# Patient Record
Sex: Male | Born: 1950 | Race: White | Hispanic: No | Marital: Married | Smoking: Never smoker
Health system: Southern US, Community
[De-identification: ages and names within clinical notes are randomized; demographics above are authoritative.]

## PROBLEM LIST (undated history)

## (undated) DIAGNOSIS — I10 Essential (primary) hypertension: Secondary | ICD-10-CM

## (undated) DIAGNOSIS — E785 Hyperlipidemia, unspecified: Secondary | ICD-10-CM

## (undated) DIAGNOSIS — K219 Gastro-esophageal reflux disease without esophagitis: Secondary | ICD-10-CM

## (undated) DIAGNOSIS — K5720 Diverticulitis of large intestine with perforation and abscess without bleeding: Secondary | ICD-10-CM

## (undated) DIAGNOSIS — K5732 Diverticulitis of large intestine without perforation or abscess without bleeding: Secondary | ICD-10-CM

## (undated) DIAGNOSIS — R51 Headache: Secondary | ICD-10-CM

## (undated) DIAGNOSIS — R519 Headache, unspecified: Secondary | ICD-10-CM

## (undated) DIAGNOSIS — K625 Hemorrhage of anus and rectum: Secondary | ICD-10-CM

## (undated) DIAGNOSIS — N400 Enlarged prostate without lower urinary tract symptoms: Secondary | ICD-10-CM

## (undated) HISTORY — DX: Diverticulitis of large intestine with perforation and abscess without bleeding: K57.20

## (undated) HISTORY — PX: FOOT SURGERY: SHX648

## (undated) HISTORY — PX: NASAL SEPTUM SURGERY: SHX37

## (undated) HISTORY — DX: Diverticulitis of large intestine without perforation or abscess without bleeding: K57.32

---

## 2013-05-18 HISTORY — PX: INGUINAL HERNIA REPAIR: SUR1180

## 2014-12-07 ENCOUNTER — Encounter
Admission: RE | Disposition: A | Discharge status: Home / Self Care | Payer: Self-pay | Source: Ambulatory Visit | Attending: Unknown Physician Specialty

## 2014-12-07 ENCOUNTER — Encounter: Payer: Self-pay | Admitting: Anesthesiology

## 2014-12-07 ENCOUNTER — Ambulatory Visit: Payer: Medicare Other | Admitting: Anesthesiology

## 2014-12-07 ENCOUNTER — Ambulatory Visit
Admission: RE | Admit: 2014-12-07 | Discharge: 2014-12-07 | Disposition: A | Discharge status: Home / Self Care | Payer: Medicare Other | Source: Ambulatory Visit | Attending: Unknown Physician Specialty | Admitting: Unknown Physician Specialty

## 2014-12-07 DIAGNOSIS — N400 Enlarged prostate without lower urinary tract symptoms: Secondary | ICD-10-CM | POA: Insufficient documentation

## 2014-12-07 DIAGNOSIS — Z1211 Encounter for screening for malignant neoplasm of colon: Secondary | ICD-10-CM | POA: Insufficient documentation

## 2014-12-07 DIAGNOSIS — K648 Other hemorrhoids: Secondary | ICD-10-CM | POA: Diagnosis not present

## 2014-12-07 DIAGNOSIS — E785 Hyperlipidemia, unspecified: Secondary | ICD-10-CM | POA: Insufficient documentation

## 2014-12-07 DIAGNOSIS — I10 Essential (primary) hypertension: Secondary | ICD-10-CM | POA: Insufficient documentation

## 2014-12-07 DIAGNOSIS — D123 Benign neoplasm of transverse colon: Secondary | ICD-10-CM | POA: Diagnosis not present

## 2014-12-07 DIAGNOSIS — D124 Benign neoplasm of descending colon: Secondary | ICD-10-CM | POA: Insufficient documentation

## 2014-12-07 DIAGNOSIS — K5730 Diverticulosis of large intestine without perforation or abscess without bleeding: Secondary | ICD-10-CM | POA: Diagnosis not present

## 2014-12-07 HISTORY — DX: Benign prostatic hyperplasia without lower urinary tract symptoms: N40.0

## 2014-12-07 HISTORY — DX: Hemorrhage of anus and rectum: K62.5

## 2014-12-07 HISTORY — DX: Headache: R51

## 2014-12-07 HISTORY — PX: COLONOSCOPY WITH PROPOFOL: SHX5780

## 2014-12-07 HISTORY — DX: Headache, unspecified: R51.9

## 2014-12-07 HISTORY — DX: Essential (primary) hypertension: I10

## 2014-12-07 HISTORY — DX: Hyperlipidemia, unspecified: E78.5

## 2014-12-07 SURGERY — COLONOSCOPY WITH PROPOFOL
Anesthesia: General

## 2014-12-07 MED ORDER — PROPOFOL INFUSION 10 MG/ML OPTIME
INTRAVENOUS | Status: DC | PRN
  Administered 2014-12-07: 120 ug/kg/min via INTRAVENOUS

## 2014-12-07 MED ORDER — SODIUM CHLORIDE 0.9 % IV SOLN: INTRAVENOUS | Status: DC

## 2014-12-07 MED ORDER — FENTANYL CITRATE (PF) 100 MCG/2ML IJ SOLN
INTRAMUSCULAR | Status: DC | PRN
  Administered 2014-12-07: 50 ug via INTRAVENOUS

## 2014-12-07 MED ORDER — CEFAZOLIN SODIUM 1-5 GM-% IV SOLN
INTRAVENOUS | Status: DC | PRN
  Administered 2014-12-07: 1 g via INTRAVENOUS

## 2014-12-07 MED ORDER — SODIUM CHLORIDE 0.9 % IV SOLN
INTRAVENOUS | Status: DC
  Administered 2014-12-07: 11:00:00 via INTRAVENOUS
  Administered 2014-12-07: 1000 mL via INTRAVENOUS

## 2014-12-07 MED ORDER — MIDAZOLAM HCL 2 MG/2ML IJ SOLN
INTRAMUSCULAR | Status: DC | PRN
  Administered 2014-12-07: 1 mg via INTRAVENOUS

## 2014-12-07 NOTE — Anesthesia Postprocedure Evaluation (Signed)
  Anesthesia Post-op Note  Patient: Ross Reynolds  Procedure(s) Performed: Procedure(s): COLONOSCOPY WITH PROPOFOL (N/A)  Anesthesia type:General  Patient location: PACU  Post pain: Pain level controlled  Post assessment: Post-op Vital signs reviewed, Patient's Cardiovascular Status Stable, Respiratory Function Stable, Patent Airway and No signs of Nausea or vomiting  Post vital signs: Reviewed and stable  Last Vitals:  Filed Vitals:   12/07/14 1120  BP: 111/69  Pulse: 67  Temp:   Resp: 17    Level of consciousness: awake, alert  and patient cooperative  Complications: No apparent anesthesia complications

## 2014-12-07 NOTE — Anesthesia Preprocedure Evaluation (Signed)
Anesthesia Evaluation  Patient identified by MRN, date of birth, ID band Patient awake    Reviewed: Allergy & Precautions, NPO status , Patient's Chart, lab work & pertinent test results, reviewed documented beta blocker date and time   Airway Mallampati: II  TM Distance: >3 FB     Dental  (+) Chipped   Pulmonary          Cardiovascular hypertension,     Neuro/Psych  Headaches,    GI/Hepatic   Endo/Other    Renal/GU      Musculoskeletal   Abdominal   Peds  Hematology   Anesthesia Other Findings   Reproductive/Obstetrics                             Anesthesia Physical Anesthesia Plan  ASA: II  Anesthesia Plan: General   Post-op Pain Management:    Induction: Intravenous  Airway Management Planned: Nasal Cannula  Additional Equipment:   Intra-op Plan:   Post-operative Plan:   Informed Consent: I have reviewed the patients History and Physical, chart, labs and discussed the procedure including the risks, benefits and alternatives for the proposed anesthesia with the patient or authorized representative who has indicated his/her understanding and acceptance.     Plan Discussed with: CRNA  Anesthesia Plan Comments:         Anesthesia Quick Evaluation

## 2014-12-07 NOTE — Transfer of Care (Signed)
Immediate Anesthesia Transfer of Care Note  Patient: Ross Reynolds  Procedure(s) Performed: Procedure(s): COLONOSCOPY WITH PROPOFOL (N/A)  Patient Location: PACU and Endoscopy Unit  Anesthesia Type:General  Level of Consciousness: awake, alert  and oriented  Airway & Oxygen Therapy: Patient Spontanous Breathing and Patient connected to nasal cannula oxygen  Post-op Assessment: Report given to RN and Post -op Vital signs reviewed and stable  Post vital signs: Reviewed and stable  Last Vitals:  Filed Vitals:   12/07/14 1109  BP: 90/51  Pulse: 79  Temp: 36.5 C  Resp: 14    Complications: No apparent anesthesia complications

## 2014-12-07 NOTE — Op Note (Signed)
[facility]enter[facility] Patient Name: Ross Reynolds Procedure Date: 12/07/2014 10:13 AM MRN: [id number] Account #: [id number] Date of Birth: Jul 05, 1950 Admit Type: Outpatient Age: 64 Room: [facility] ENDO ROOM 1 Gender: Male Note Status: Finalized Procedure:         Colonoscopy Indications:       Screening for colorectal malignant neoplasm Providers:         Manya Silvas, MD Referring MD:      Caprice Renshaw (Referring MD) Medicines:         Propofol per Anesthesia Complications:     No immediate complications. Procedure:         Pre-Anesthesia Assessment:                    - After reviewing the risks and benefits, the patient was                     deemed in satisfactory condition to undergo the procedure.                    After obtaining informed consent, the colonoscope was                     passed under direct vision. Throughout the procedure, the                     patient's blood pressure, pulse, and oxygen saturations                     were monitored continuously. The Colonoscope was                     introduced through the anus and advanced to the the cecum,                     identified by appendiceal orifice and ileocecal valve. The                     colonoscopy was performed without difficulty. The patient                     tolerated the procedure well. The quality of the bowel                     preparation was good. Findings:      Patient given a gram of Ancef before the procedure slow iv push.      A diminutive polyp was found at the hepatic flexure. The polyp was       sessile. The polyp was removed with a jumbo cold forceps. Resection and       retrieval were complete.      A diminutive polyp was found at the splenic flexure. The polyp was       sessile. The polyp was removed with a jumbo cold forceps. Resection and       retrieval were complete.      A diminutive polyp was found in the descending colon. The polyp was    sessile. The polyp was removed with a jumbo cold forceps. Resection and       retrieval were complete.      Multiple small-mouthed diverticula were found in the sigmoid colon, in       the descending colon and in the transverse colon.      Internal hemorrhoids  were found during endoscopy. The hemorrhoids were       small and Grade I (internal hemorrhoids that do not prolapse).      The exam was otherwise without abnormality. Impression:        - One diminutive polyp at the hepatic flexure. Resected                     and retrieved.                    - One diminutive polyp at the splenic flexure. Resected                     and retrieved.                    - One diminutive polyp in the descending colon. Resected                     and retrieved.                    - Diverticulosis in the sigmoid colon, in the descending                     colon and in the transverse colon.                    - Internal hemorrhoids.                    - The examination was otherwise normal. Recommendation:    - Await pathology results. Manya Silvas, MD 12/07/2014 11:06:17 AM This report has been signed electronically. Number of Addenda: 0 Note Initiated On: 12/07/2014 10:13 AM Scope Withdrawal Time: 0 hours 14 minutes 57 seconds  Total Procedure Duration: 0 hours 21 minutes 32 seconds       [facility]

## 2014-12-07 NOTE — H&P (Signed)
Primary Care Physician:  Marcello Fennel, MD Primary Gastroenterologist:  Dr. Vira Agar  Pre-Procedure History & Physical: HPI:  Ross Reynolds is a 64 y.o. male is here for an colonoscopy.   Past Medical History  Diagnosis Date  . Hypertension   . Headache   . Rectal bleeding   . BPH (benign prostatic hypertrophy)   . Hyperlipemia     History reviewed. No pertinent past surgical history.  Prior to Admission medications   Medication Sig Start Date End Date Taking? Authorizing Provider  fenofibrate (TRICOR) 145 MG tablet Take 145 mg by mouth daily.   Yes Historical Provider, MD  fluticasone (VERAMYST) 27.5 MCG/SPRAY nasal spray Place 2 sprays into the nose daily.   Yes Historical Provider, MD  meloxicam (MOBIC) 15 MG tablet Take 15 mg by mouth daily.   Yes Historical Provider, MD  tamsulosin (FLOMAX) 0.4 MG CAPS capsule Take 0.4 mg by mouth.   Yes Historical Provider, MD  tiZANidine (ZANAFLEX) 4 MG capsule Take 4 mg by mouth 3 (three) times daily.   Yes Historical Provider, MD    Allergies as of 12/06/2014  . (No Known Allergies)    History reviewed. No pertinent family history.  History   Social History  . Marital Status: Married    Spouse Name: N/A  . Number of Children: N/A  . Years of Education: N/A   Occupational History  . Not on file.   Social History Main Topics  . Smoking status: Not on file  . Smokeless tobacco: Not on file  . Alcohol Use: Not on file  . Drug Use: Not on file  . Sexual Activity: Not on file   Other Topics Concern  . Not on file   Social History Narrative    Review of Systems: See HPI, otherwise negative ROS  Physical Exam: BP 129/94 mmHg  Pulse 91  Temp(Src) 97.9 F (36.6 C) (Tympanic)  Resp 16  Ht 5\' 11"  (1.803 m)  Wt 88.451 kg (195 lb)  BMI 27.21 kg/m2  SpO2 94% General:   Alert,  pleasant and cooperative in NAD Head:  Normocephalic and atraumatic. Neck:  Supple; no masses or thyromegaly. Lungs:  Clear throughout to  auscultation.    Heart:  Regular rate and rhythm. Abdomen:  Soft, nontender and nondistended. Normal bowel sounds, without guarding, and without rebound.   Neurologic:  Alert and  oriented x4;  grossly normal neurologically.  Impression/Plan: Ross Reynolds is here for an colonoscopy to be performed for screening  Risks, benefits, limitations, and alternatives regarding  colonoscopy have been reviewed with the patient.  Questions have been answered.  All parties agreeable.   Ross Cheers, MD  12/07/2014, 10:16 AM   Primary Care Physician:  Marcello Fennel, MD Primary Gastroenterologist:  Dr. Vira Agar  Pre-Procedure History & Physical: HPI:  Ross Reynolds is a 64 y.o. male is here for an colonoscopy.   Past Medical History  Diagnosis Date  . Hypertension   . Headache   . Rectal bleeding   . BPH (benign prostatic hypertrophy)   . Hyperlipemia     History reviewed. No pertinent past surgical history.  Prior to Admission medications   Medication Sig Start Date End Date Taking? Authorizing Provider  fenofibrate (TRICOR) 145 MG tablet Take 145 mg by mouth daily.   Yes Historical Provider, MD  fluticasone (VERAMYST) 27.5 MCG/SPRAY nasal spray Place 2 sprays into the nose daily.   Yes Historical Provider, MD  meloxicam (MOBIC) 15 MG tablet Take 15  mg by mouth daily.   Yes Historical Provider, MD  tamsulosin (FLOMAX) 0.4 MG CAPS capsule Take 0.4 mg by mouth.   Yes Historical Provider, MD  tiZANidine (ZANAFLEX) 4 MG capsule Take 4 mg by mouth 3 (three) times daily.   Yes Historical Provider, MD    Allergies as of 12/06/2014  . (No Known Allergies)    History reviewed. No pertinent family history.  History   Social History  . Marital Status: Married    Spouse Name: N/A  . Number of Children: N/A  . Years of Education: N/A   Occupational History  . Not on file.   Social History Main Topics  . Smoking status: Not on file  . Smokeless tobacco: Not on file  . Alcohol Use:  Not on file  . Drug Use: Not on file  . Sexual Activity: Not on file   Other Topics Concern  . Not on file   Social History Narrative    Review of Systems: See HPI, otherwise negative ROS  Physical Exam: BP 129/94 mmHg  Pulse 91  Temp(Src) 97.9 F (36.6 C) (Tympanic)  Resp 16  Ht 5\' 11"  (1.803 m)  Wt 88.451 kg (195 lb)  BMI 27.21 kg/m2  SpO2 94% General:   Alert,  pleasant and cooperative in NAD Head:  Normocephalic and atraumatic. Neck:  Supple; no masses or thyromegaly. Lungs:  Clear throughout to auscultation.    Heart:  Regular rate and rhythm. Abdomen:  Soft, nontender and nondistended. Normal bowel sounds, without guarding, and without rebound.   Neurologic:  Alert and  oriented x4;  grossly normal neurologically.  Impression/Plan: Ross Reynolds is here for an colonoscopy to be performed for screening  Risks, benefits, limitations, and alternatives regarding  colonoscopy have been reviewed with the patient.  Questions have been answered.  All parties agreeable.   Ross Cheers, MD  12/07/2014, 10:16 AM

## 2014-12-10 ENCOUNTER — Encounter: Payer: Self-pay | Admitting: Unknown Physician Specialty

## 2014-12-11 LAB — SURGICAL PATHOLOGY

## 2017-05-11 ENCOUNTER — Inpatient Hospital Stay
Admission: EM | Admit: 2017-05-11 | Discharge: 2017-05-14 | DRG: 392 | Disposition: A | Discharge status: Home / Self Care | Payer: Medicare Other | Attending: Surgery | Admitting: Surgery

## 2017-05-11 ENCOUNTER — Emergency Department: Payer: Medicare Other

## 2017-05-11 DIAGNOSIS — K5720 Diverticulitis of large intestine with perforation and abscess without bleeding: Secondary | ICD-10-CM | POA: Diagnosis present

## 2017-05-11 DIAGNOSIS — I10 Essential (primary) hypertension: Secondary | ICD-10-CM | POA: Diagnosis present

## 2017-05-11 DIAGNOSIS — R1084 Generalized abdominal pain: Secondary | ICD-10-CM

## 2017-05-11 DIAGNOSIS — Z79899 Other long term (current) drug therapy: Secondary | ICD-10-CM | POA: Diagnosis not present

## 2017-05-11 DIAGNOSIS — E872 Acidosis: Secondary | ICD-10-CM | POA: Diagnosis present

## 2017-05-11 DIAGNOSIS — E785 Hyperlipidemia, unspecified: Secondary | ICD-10-CM | POA: Diagnosis present

## 2017-05-11 DIAGNOSIS — K5900 Constipation, unspecified: Secondary | ICD-10-CM | POA: Diagnosis present

## 2017-05-11 DIAGNOSIS — K5732 Diverticulitis of large intestine without perforation or abscess without bleeding: Secondary | ICD-10-CM | POA: Diagnosis present

## 2017-05-11 HISTORY — DX: Diverticulitis of large intestine without perforation or abscess without bleeding: K57.32

## 2017-05-11 LAB — URINALYSIS, COMPLETE (UACMP) WITH MICROSCOPIC
BACTERIA UA: NONE SEEN
BILIRUBIN URINE: NEGATIVE
Glucose, UA: NEGATIVE mg/dL
Hgb urine dipstick: NEGATIVE
Ketones, ur: NEGATIVE mg/dL
LEUKOCYTES UA: NEGATIVE
Nitrite: NEGATIVE
PH: 7 (ref 5.0–8.0)
Protein, ur: NEGATIVE mg/dL
SPECIFIC GRAVITY, URINE: 1.021 (ref 1.005–1.030)
SQUAMOUS EPITHELIAL / LPF: NONE SEEN

## 2017-05-11 LAB — COMPREHENSIVE METABOLIC PANEL
ALBUMIN: 4.3 g/dL (ref 3.5–5.0)
ALT: 34 U/L (ref 17–63)
AST: 37 U/L (ref 15–41)
Alkaline Phosphatase: 59 U/L (ref 38–126)
Anion gap: 10 (ref 5–15)
BUN: 19 mg/dL (ref 6–20)
CHLORIDE: 107 mmol/L (ref 101–111)
CO2: 18 mmol/L — ABNORMAL LOW (ref 22–32)
CREATININE: 0.93 mg/dL (ref 0.61–1.24)
Calcium: 9.1 mg/dL (ref 8.9–10.3)
GFR calc Af Amer: >60 mL/min (ref >60)
GLUCOSE: 100 mg/dL — AB (ref 65–99)
POTASSIUM: 4.6 mmol/L (ref 3.5–5.1)
Sodium: 135 mmol/L (ref 135–145)
Total Bilirubin: 1 mg/dL (ref 0.3–1.2)
Total Protein: 7.8 g/dL (ref 6.5–8.1)

## 2017-05-11 LAB — CBC
HEMATOCRIT: 44.7 % (ref 40.0–52.0)
Hemoglobin: 15.1 g/dL (ref 13.0–18.0)
MCH: 30.8 pg (ref 26.0–34.0)
MCHC: 33.8 g/dL (ref 32.0–36.0)
MCV: 90.9 fL (ref 80.0–100.0)
PLATELETS: UNDETERMINED 10*3/uL (ref 150–440)
RBC: 4.92 MIL/uL (ref 4.40–5.90)
RDW: 13.9 % (ref 11.5–14.5)
WBC: 16 10*3/uL — ABNORMAL HIGH (ref 3.8–10.6)

## 2017-05-11 LAB — LIPASE, BLOOD: LIPASE: 32 U/L (ref 11–51)

## 2017-05-11 MED ORDER — MORPHINE SULFATE (PF) 2 MG/ML IV SOLN: 2.0000 mg | INTRAVENOUS | Status: DC | PRN

## 2017-05-11 MED ORDER — PIPERACILLIN-TAZOBACTAM 3.375 G IVPB
3.3750 g | Freq: Three times a day (TID) | INTRAVENOUS | Status: DC
  Administered 2017-05-12 – 2017-05-13 (×4): 3.375 g via INTRAVENOUS
  Filled 2017-05-11 (×4): qty 50

## 2017-05-11 MED ORDER — ENOXAPARIN SODIUM 40 MG/0.4ML ~~LOC~~ SOLN
40.0000 mg | SUBCUTANEOUS | Status: DC
  Administered 2017-05-12 – 2017-05-13 (×3): 40 mg via SUBCUTANEOUS
  Filled 2017-05-11 (×3): qty 0.4

## 2017-05-11 MED ORDER — LOSARTAN POTASSIUM 50 MG PO TABS
50.0000 mg | ORAL_TABLET | Freq: Every day | ORAL | Status: DC
  Administered 2017-05-13 – 2017-05-14 (×2): 50 mg via ORAL
  Filled 2017-05-11 (×2): qty 1

## 2017-05-11 MED ORDER — SODIUM CHLORIDE 0.9 % IV BOLUS (SEPSIS): 1000.0000 mL | Freq: Once | INTRAVENOUS | Status: DC

## 2017-05-11 MED ORDER — ONDANSETRON HCL 4 MG/2ML IJ SOLN: 4.0000 mg | Freq: Four times a day (QID) | INTRAMUSCULAR | Status: DC | PRN

## 2017-05-11 MED ORDER — PANTOPRAZOLE SODIUM 40 MG IV SOLR
40.0000 mg | Freq: Every day | INTRAVENOUS | Status: DC
  Administered 2017-05-12 – 2017-05-13 (×3): 40 mg via INTRAVENOUS
  Filled 2017-05-11 (×3): qty 40

## 2017-05-11 MED ORDER — SODIUM CHLORIDE 0.9 % IV BOLUS (SEPSIS)
1000.0000 mL | Freq: Once | INTRAVENOUS | Status: AC
  Administered 2017-05-11: 1000 mL via INTRAVENOUS

## 2017-05-11 MED ORDER — IOPAMIDOL (ISOVUE-370) INJECTION 76%
100.0000 mL | Freq: Once | INTRAVENOUS | Status: AC | PRN
  Administered 2017-05-11: 100 mL via INTRAVENOUS

## 2017-05-11 MED ORDER — OXYCODONE HCL 5 MG PO TABS: 5.0000 mg | ORAL_TABLET | ORAL | Status: DC | PRN

## 2017-05-11 MED ORDER — PIPERACILLIN-TAZOBACTAM 3.375 G IVPB: 3.3750 g | Freq: Three times a day (TID) | INTRAVENOUS | Status: DC

## 2017-05-11 MED ORDER — LACTATED RINGERS IV SOLN
INTRAVENOUS | Status: DC
  Administered 2017-05-12 – 2017-05-13 (×4): via INTRAVENOUS

## 2017-05-11 MED ORDER — ONDANSETRON 4 MG PO TBDP: 4.0000 mg | ORAL_TABLET | Freq: Four times a day (QID) | ORAL | Status: DC | PRN

## 2017-05-11 MED ORDER — TAMSULOSIN HCL 0.4 MG PO CAPS
0.4000 mg | ORAL_CAPSULE | Freq: Every day | ORAL | Status: DC
  Administered 2017-05-13 – 2017-05-14 (×2): 0.4 mg via ORAL
  Filled 2017-05-11 (×2): qty 1

## 2017-05-11 MED ORDER — ACETAMINOPHEN 500 MG PO TABS
1000.0000 mg | ORAL_TABLET | Freq: Four times a day (QID) | ORAL | Status: DC
  Administered 2017-05-12 – 2017-05-13 (×3): 1000 mg via ORAL
  Filled 2017-05-11 (×3): qty 2

## 2017-05-11 MED ORDER — KETOROLAC TROMETHAMINE 30 MG/ML IJ SOLN
15.0000 mg | Freq: Four times a day (QID) | INTRAMUSCULAR | Status: DC | PRN
  Administered 2017-05-12 – 2017-05-13 (×5): 15 mg via INTRAVENOUS
  Filled 2017-05-11 (×5): qty 1

## 2017-05-11 MED ORDER — PIPERACILLIN-TAZOBACTAM 3.375 G IVPB 30 MIN
3.3750 g | Freq: Once | INTRAVENOUS | Status: AC
  Administered 2017-05-11: 3.375 g via INTRAVENOUS
  Filled 2017-05-11: qty 50

## 2017-05-11 NOTE — ED Triage Notes (Signed)
Pt presents via POV c/o abd pain. Report LLQ pain intermittently for a few weeks however now with RLQ pain and fever 100.8 at home. Report hx diverticulitis. Denies bloody diarrhea. Pt ambulatory to triage.

## 2017-05-11 NOTE — ED Notes (Signed)
Surgeon at bedside.  

## 2017-05-11 NOTE — ED Notes (Signed)
Pt taken to CT via stretcher.

## 2017-05-11 NOTE — ED Provider Notes (Signed)
[facility] Emergency Department Provider Note    First MD Initiated Contact with Patient 05/11/17 2139     (approximate)  I have reviewed the triage vital signs and the nursing notes.   HISTORY  Chief Complaint Abdominal Pain    HPI Ross Wilkie. is a 66 y.o. male with chief complaint of generalized left lower quadrant pain radiating to right lower quadrant pain with fever to 100.8 at home today.  Patient does have a history of diverticulitis.  States his been feeling unwell for the past several days with pain coming and going.  States the pain is mild to moderate in severity.  No nausea vomiting or diarrhea.  No chest pain or shortness of breath.  Past Medical History:  Diagnosis Date  . BPH (benign prostatic hypertrophy)   . Headache   . Hyperlipemia   . Hypertension   . Rectal bleeding    History reviewed. No pertinent family history. Past Surgical History:  Procedure Laterality Date  . COLONOSCOPY WITH PROPOFOL N/A 12/07/2014   Procedure: COLONOSCOPY WITH PROPOFOL;  Surgeon: Manya Silvas, MD;  Location: [facility] ENDOSCOPY;  Service: Endoscopy;  Laterality: N/A;   There are no active problems to display for this patient.     Prior to Admission medications   Medication Sig Start Date End Date Taking? Authorizing Provider  fenofibrate (TRICOR) 145 MG tablet Take 145 mg by mouth daily.    [provider]  fluticasone (VERAMYST) 27.5 MCG/SPRAY nasal spray Place 2 sprays into the nose daily.    [provider]  meloxicam (MOBIC) 15 MG tablet Take 15 mg by mouth daily.    [provider]  tamsulosin (FLOMAX) 0.4 MG CAPS capsule Take 0.4 mg by mouth.    [provider]  tiZANidine (ZANAFLEX) 4 MG capsule Take 4 mg by mouth 3 (three) times daily.    [provider]    Allergies Patient has no known allergies.    Social History Social History   Tobacco Use  . Smoking status: Never Smoker  .  Smokeless tobacco: Never Used  Substance Use Topics  . Alcohol use: Not on file  . Drug use: Not on file    Review of Systems Patient denies headaches, rhinorrhea, blurry vision, numbness, shortness of breath, chest pain, edema, cough, abdominal pain, nausea, vomiting, diarrhea, dysuria, fevers, rashes or hallucinations unless otherwise stated above in HPI. ____________________________________________   PHYSICAL EXAM:  VITAL SIGNS: Vitals:   05/11/17 1857 05/11/17 2155  BP: (!) 161/84 128/75  Pulse: (!) 117 98  Resp: 14 16  Temp: 98.6 F (37 C)   SpO2: 97% 96%    Constitutional: Alert and oriented. Well appearing and in no acute distress. Eyes: Conjunctivae are normal.  Head: Atraumatic. Nose: No congestion/rhinnorhea. Mouth/Throat: Mucous membranes are moist.   Neck: No stridor. Painless ROM.  Cardiovascular: Normal rate, regular rhythm. Grossly normal heart sounds.  Good peripheral circulation. Respiratory: Normal respiratory effort.  No retractions. Lungs CTAB. Gastrointestinal: Soft and with diffuse tenderness most prominent in llq No distention. No abdominal bruits. No CVA tenderness. Musculoskeletal: No lower extremity tenderness nor edema.  No joint effusions. Neurologic:  Normal speech and language. No gross focal neurologic deficits are appreciated. No facial droop Skin:  Skin is warm, dry and intact. No rash noted. Psychiatric: Mood and affect are normal. Speech and behavior are normal.  ____________________________________________   LABS (all labs ordered are listed, but only abnormal results are displayed)  Results for  orders placed or performed during the hospital encounter of 05/11/17 (from the past 24 hour(s))  Lipase, blood     Status: None   Collection Time: 05/11/17  6:58 PM  Result Value Ref Range   Lipase 32 11 - 51 U/L  Comprehensive metabolic panel     Status: Abnormal   Collection Time: 05/11/17  6:58 PM  Result Value Ref Range   Sodium 135  135 - 145 mmol/L   Potassium 4.6 3.5 - 5.1 mmol/L   Chloride 107 101 - 111 mmol/L   CO2 18 (L) 22 - 32 mmol/L   Glucose, Bld 100 (H) 65 - 99 mg/dL   BUN 19 6 - 20 mg/dL   Creatinine, Ser 0.93 0.61 - 1.24 mg/dL   Calcium 9.1 8.9 - 10.3 mg/dL   Total Protein 7.8 6.5 - 8.1 g/dL   Albumin 4.3 3.5 - 5.0 g/dL   AST 37 15 - 41 U/L   ALT 34 17 - 63 U/L   Alkaline Phosphatase 59 38 - 126 U/L   Total Bilirubin 1.0 0.3 - 1.2 mg/dL   GFR calc non Af Amer >60 >60 mL/min   GFR calc Af Amer >60 >60 mL/min   Anion gap 10 5 - 15  CBC     Status: Abnormal   Collection Time: 05/11/17  6:58 PM  Result Value Ref Range   WBC 16.0 (H) 3.8 - 10.6 K/uL   RBC 4.92 4.40 - 5.90 MIL/uL   Hemoglobin 15.1 13.0 - 18.0 g/dL   HCT 44.7 40.0 - 52.0 %   MCV 90.9 80.0 - 100.0 fL   MCH 30.8 26.0 - 34.0 pg   MCHC 33.8 32.0 - 36.0 g/dL   RDW 13.9 11.5 - 14.5 %   Platelets PLATELET CLUMPS NOTED ON SMEAR, UNABLE TO ESTIMATE 150 - 440 K/uL   ____________________________________________ ___________________________________  RADIOLOGY  I personally reviewed all radiographic images ordered to evaluate for the above acute complaints and reviewed radiology reports and findings.  These findings were personally discussed with the patient.  Please see medical record for radiology report.  ____________________________________________   PROCEDURES  Procedure(s) performed:  Procedures    Critical Care performed: no ____________________________________________   INITIAL IMPRESSION / ASSESSMENT AND PLAN / ED COURSE  Pertinent labs & imaging results that were available during my care of the patient were reviewed by me and considered in my medical decision making (see chart for details).  DDX: Appendicitis, colitis, diverticulitis, perforation, enteritis  Ross Champoux. is a 66 y.o. who presents to the ED with abdominal pain as described above with evidence of acute leukocytosis and fever at home.  Patient mild  tachycardia upon arrival improving after pain medication.  CT imaging ordered to evaluate for the above differential shows evidence of acute diverticulitis with microperforation.  Spoke with Dr. Adora Fridge of general surgery who kindly agrees to admit patient for further management.  Have discussed with the patient and available family all diagnostics and treatments performed thus far and all questions were answered to the best of my ability. The patient demonstrates understanding and agreement with plan.       ____________________________________________   FINAL CLINICAL IMPRESSION(S) / ED DIAGNOSES  Final diagnoses:  Generalized abdominal pain  Perforation of sigmoid colon due to diverticulitis      NEW MEDICATIONS STARTED DURING THIS VISIT:  This SmartLink is deprecated. Use AVSMEDLIST instead to display the medication list for a patient.   Note:  This document was prepared using  Dragon [employer] forces training and education officer and may include unintentional dictation errors.    Merlyn Lot, MD 05/11/17 [phone]

## 2017-05-11 NOTE — Progress Notes (Signed)
Pharmacy Antibiotic Note  Ross Reynolds. is a 66 y.o. male admitted on 05/11/2017 with intra-abdominal infection.  Pharmacy has been consulted for Zosyn dosing.  Plan: Zosyn 3.375g IV q8h (4 hour infusion).  Height: 5\' 11"  (180.3 cm) Weight: 190 lb (86.2 kg) IBW/kg (Calculated) : 75.3  Temp (24hrs), Avg:98.6 F (37 C), Min:98.6 F (37 C), Max:98.6 F (37 C)  Recent Labs  Lab 05/11/17 1858  WBC 16.0*  CREATININE 0.93    Estimated Creatinine Clearance: 81 mL/min (by C-G formula based on SCr of 0.93 mg/dL).    No Known Allergies  Antimicrobials this admission: Zosyn 12/25  >>    >>   Dose adjustments this admission:   Microbiology results: No micro      12/25 UA: (-) Thank you for allowing pharmacy to be a part of this patient's care.  Ross Reynolds S 05/11/2017 11:01 PM

## 2017-05-11 NOTE — ED Triage Notes (Signed)
First Nurse Note:  Arrives with C/O lower abdominal pain.  States pain initially started a few days ago, but today pain moved to lower abdomen.  Patient states he has low grade fever of 100.8 today.  AAOx3.  Skin warm and dry. NAD

## 2017-05-11 NOTE — H&P (Signed)
Patient ID: Ross Breech., male   DOB: [birth year], 66 y.o.   MRN: [id number]  HPI Ross Valeriano. is a 66 y.o. male comes in to the emergency room with worsening of lower abdominal pain. He reports that over the last few weeks he has been having some left lower quadrant pain that was dull and Dr. Vira Agar told him that he had diverticulitis and he did receive a course of antibiotics. Now he comes in with worsening of the pain and now located in the right lower quadrant. This time the pain is sharp is moderate in intensity and he did have also seated low-grade temperatures. No fevers no chills. No shortness of breath. He reports constipation. No melena. His been taking by mouth well. Upon further questioning he reports that this is his fourth episode of diverticulitis but the first one requiring hospitalization. The first episode was about 2-3 years ago. He sees Dr. Vira Agar regular basis and had a previous colonoscopy 2 years ago,  diverticulosis was identified as well as a TA. Splenic flexure. . I have personally reviewed his CT scan showing evidence of diverticulitis with microperforation. No evidence of overt free air. No abscess. Wbc 16 k, nml creat.  Co2 18.  HPI  Past Medical History:  Diagnosis Date  . BPH (benign prostatic hypertrophy)   . Headache   . Hyperlipemia   . Hypertension   . Rectal bleeding     Past Surgical History:  Procedure Laterality Date  . COLONOSCOPY WITH PROPOFOL N/A 12/07/2014   Procedure: COLONOSCOPY WITH PROPOFOL;  Surgeon: Manya Silvas, MD;  Location: [facility] ENDOSCOPY;  Service: Endoscopy;  Laterality: N/A;    History reviewed. No pertinent family history.  Social History Social History   Tobacco Use  . Smoking status: Never Smoker  . Smokeless tobacco: Never Used  Substance Use Topics  . Alcohol use: Not on file  . Drug use: Not on file    No Known Allergies  Current Facility-Administered Medications  Medication Dose Route Frequency Provider  Last Rate Last Dose  . [START ON 05/12/2017] acetaminophen (TYLENOL) tablet 1,000 mg  1,000 mg Oral Q6H Pabon, Diego F, MD      . enoxaparin (LOVENOX) injection 40 mg  40 mg Subcutaneous Q24H Pabon, Diego F, MD      . ketorolac (TORADOL) 30 MG/ML injection 15 mg  15 mg Intravenous Q6H PRN Pabon, Diego F, MD      . lactated ringers infusion   Intravenous Continuous Pabon, Diego F, MD      . Derrill Memo ON 05/12/2017] losartan (COZAAR) tablet 50 mg  50 mg Oral Daily Pabon, Diego F, MD      . morphine 2 MG/ML injection 2 mg  2 mg Intravenous Q2H PRN Pabon, Diego F, MD      . ondansetron (ZOFRAN-ODT) disintegrating tablet 4 mg  4 mg Oral Q6H PRN Pabon, Diego F, MD       Or  . ondansetron (ZOFRAN) injection 4 mg  4 mg Intravenous Q6H PRN Pabon, Diego F, MD      . oxyCODONE (Oxy IR/ROXICODONE) immediate release tablet 5-10 mg  5-10 mg Oral Q4H PRN Pabon, Diego F, MD      . pantoprazole (PROTONIX) injection 40 mg  40 mg Intravenous QHS Pabon, Diego F, MD      . Derrill Memo ON 05/12/2017] piperacillin-tazobactam (ZOSYN) IVPB 3.375 g  3.375 g Intravenous Shanda Howells, MD      . piperacillin-tazobactam (ZOSYN) IVPB 3.375 g  3.375 g Intravenous Q8H Pabon, Diego F, MD      . sodium chloride 0.9 % bolus 1,000 mL  1,000 mL Intravenous Once Pabon, Diego F, MD      . Derrill Memo ON 05/12/2017] tamsulosin (FLOMAX) capsule 0.4 mg  0.4 mg Oral Daily Pabon, Diego F, MD       Current Outpatient Medications  Medication Sig Dispense Refill  . fenofibrate (TRICOR) 145 MG tablet Take 145 mg by mouth daily.    Marland Kitchen losartan-hydrochlorothiazide (HYZAAR) 50-12.5 MG tablet Take 1 tablet by mouth daily.    . methocarbamol (ROBAXIN) 500 MG tablet Take 500 mg by mouth daily as needed for muscle spasms.    . tamsulosin (FLOMAX) 0.4 MG CAPS capsule Take 0.4 mg by mouth.       Review of Systems Full ROS  was asked and was negative except for the information on the HPI  Physical Exam Blood pressure 128/75, pulse 98, temperature  98.6 F (37 C), temperature source Oral, resp. rate 16, height 5\' 11"  (1.803 m), weight 86.2 kg (190 lb), SpO2 96 %. CONSTITUTIONAL: NAD EYES: Pupils are equal, round, and reactive to light, Sclera are non-icteric. EARS, NOSE, MOUTH AND THROAT: The oropharynx is clear. The oral mucosa is pink and moist. Hearing is intact to voice. LYMPH NODES:  Lymph nodes in the neck are normal. RESPIRATORY:  Lungs are clear. There is normal respiratory effort, with equal breath sounds bilaterally, and without pathologic use of accessory muscles. CARDIOVASCULAR: Heart is regular without murmurs, gallops, or rubs. GI: The abdomen is soft TTP RLQ and LLQ< no peritonitis.  There are no palpable masses. There is no hepatosplenomegaly. There are normal bowel sounds in all quadrants. GU: Rectal deferred.   MUSCULOSKELETAL: Normal muscle strength and tone. No cyanosis or edema.   SKIN: Turgor is good and there are no pathologic skin lesions or ulcers. NEUROLOGIC: Motor and sensation is grossly normal. Cranial nerves are grossly intact. PSYCH:  Oriented to person, place and time. Affect is normal.  Data Reviewed  I have personally reviewed the patient's imaging, laboratory findings and medical records.    Assessment/Plan  Diverticulitis with microperforation. No evidence of peritonitis of frank perforation. Recommend hospitalization with IV antibiotics, I am going to give him an extra liter of normal saline because of his mild acidosis. We'll repeat a CBC and a CMP in the morning. We'll keep him nothing by mouth for now. Serial abdominal exams. No need for emergent surgical indication at this time.  The patient will benefit from elective sigmoid colectomy after this episode subsides. He understands that there is a small chance that he may get worse and in that case he may need a repeat CT scan or even a Hartman's procedure. Again this is unlikely but he was explained to the patient. HE understands and agrees with  the plan.  Caroleen Hamman, MD FACS General Surgeon 05/11/2017, 11:31 PM

## 2017-05-12 ENCOUNTER — Other Ambulatory Visit: Payer: Self-pay

## 2017-05-12 DIAGNOSIS — K5720 Diverticulitis of large intestine with perforation and abscess without bleeding: Secondary | ICD-10-CM

## 2017-05-12 LAB — CBC
HCT: 39.7 % — ABNORMAL LOW (ref 40.0–52.0)
HEMOGLOBIN: 13.4 g/dL (ref 13.0–18.0)
MCH: 30.3 pg (ref 26.0–34.0)
MCHC: 33.6 g/dL (ref 32.0–36.0)
MCV: 90.1 fL (ref 80.0–100.0)
PLATELETS: 231 10*3/uL (ref 150–440)
RBC: 4.41 MIL/uL (ref 4.40–5.90)
RDW: 13.7 % (ref 11.5–14.5)
WBC: 11.4 10*3/uL — ABNORMAL HIGH (ref 3.8–10.6)

## 2017-05-12 LAB — COMPREHENSIVE METABOLIC PANEL
ALBUMIN: 3.4 g/dL — AB (ref 3.5–5.0)
ALT: 23 U/L (ref 17–63)
AST: 23 U/L (ref 15–41)
Alkaline Phosphatase: 36 U/L — ABNORMAL LOW (ref 38–126)
Anion gap: 4 — ABNORMAL LOW (ref 5–15)
BUN: 15 mg/dL (ref 6–20)
CHLORIDE: 110 mmol/L (ref 101–111)
CO2: 22 mmol/L (ref 22–32)
CREATININE: 0.83 mg/dL (ref 0.61–1.24)
Calcium: 8.6 mg/dL — ABNORMAL LOW (ref 8.9–10.3)
GFR calc non Af Amer: >60 mL/min (ref >60)
GLUCOSE: 92 mg/dL (ref 65–99)
Potassium: 3.5 mmol/L (ref 3.5–5.1)
SODIUM: 136 mmol/L (ref 135–145)
Total Bilirubin: 1.1 mg/dL (ref 0.3–1.2)
Total Protein: 6.3 g/dL — ABNORMAL LOW (ref 6.5–8.1)

## 2017-05-12 LAB — MAGNESIUM: Magnesium: 2 mg/dL (ref 1.7–2.4)

## 2017-05-12 MED ORDER — POLYVINYL ALCOHOL 1.4 % OP SOLN
1.0000 [drp] | OPHTHALMIC | Status: DC | PRN
  Administered 2017-05-12: 1 [drp] via OPHTHALMIC
  Filled 2017-05-12: qty 15

## 2017-05-12 NOTE — Progress Notes (Signed)
CC: Diverticulitis Subjective: 66 year old male admitted with sigmoid diverticulitis with microperforation overnight.  Patient reports he is feeling better this morning than on admission but his abdominal discomfort remains with movement.  Denies any nausea or vomiting.  Objective: Vital signs in last 24 hours: Temp:  [97.7 F (36.5 C)-98.6 F (37 C)] 97.7 F (36.5 C) (12/26 0552) Pulse Rate:  [75-117] 75 (12/26 0552) Resp:  [14-20] 20 (12/26 0552) BP: (107-161)/(61-84) 107/61 (12/26 0552) SpO2:  [96 %-99 %] 97 % (12/26 0552) Weight:  [86.2 kg (190 lb)-89.4 kg (197 lb)] 89.4 kg (197 lb) (12/26 0016) Last BM Date: 05/11/17  Intake/Output from previous day: 12/25 0701 - 12/26 0700 In: -  Out: 850 [Urine:850] Intake/Output this shift: Total I/O In: 830 [I.V.:805; IV Piggyback:25] Out: 0   Physical exam:  General: No acute distress Chest: Clear to auscultation Heart: Regular rate and rhythm Abdomen: Soft, nondistended, mildly tender to deep palpation in the lower abdomen  Lab Results: CBC  Recent Labs    05/11/17 1858 05/12/17 0453  WBC 16.0* 11.4*  HGB 15.1 13.4  HCT 44.7 39.7*  PLT PLATELET CLUMPS NOTED ON SMEAR, UNABLE TO ESTIMATE 231   BMET Recent Labs    05/11/17 1858 05/12/17 0453  NA 135 136  K 4.6 3.5  CL 107 110  CO2 18* 22  GLUCOSE 100* 92  BUN 19 15  CREATININE 0.93 0.83  CALCIUM 9.1 8.6*   PT/INR No results for input(s): LABPROT, INR in the last 72 hours. ABG No results for input(s): PHART, HCO3 in the last 72 hours.  Invalid input(s): PCO2, PO2  Studies/Results: Ct Abdomen Pelvis W Contrast  Result Date: 05/11/2017 CLINICAL DATA:  66 y/o M; 66 y/o M; left lower quadrant pain intermittently for a few weeks. Now presenting with right lower quadrant pain and low-grade fever. EXAM: CT ABDOMEN AND PELVIS WITH CONTRAST TECHNIQUE: Multidetector CT imaging of the abdomen and pelvis was performed using the standard protocol following bolus  administration of intravenous contrast. CONTRAST:  16mL ISOVUE-370 IOPAMIDOL (ISOVUE-370) INJECTION 76% COMPARISON:  None. FINDINGS: Lower chest: Mild coronary artery and aortic valvular calcification. Hepatobiliary: Hepatic steatosis. Cholelithiasis. No focal liver lesion identified. No biliary ductal dilatation. Pancreas: Unremarkable. No pancreatic ductal dilatation or surrounding inflammatory changes. Spleen: Normal in size without focal abnormality. Adrenals/Urinary Tract: Adrenal glands are unremarkable. Kidneys are normal, without renal calculi, focal lesion, or hydronephrosis. Bladder is unremarkable. Stomach/Bowel: Acute sigmoid diverticulitis. Punctate focus of extraluminal air compatible with microperforation. No abscess. Normal appendix. No additional obstructive or inflammatory changes of stomach, small bowel, or large bowel. Vascular/Lymphatic: Aortic atherosclerosis. No enlarged abdominal or pelvic lymph nodes. Reproductive: Moderate prostate enlargement and coarse calcification. Other: No abdominal wall hernia or abnormality. No abdominopelvic ascites. Musculoskeletal: No acute or significant osseous findings. IMPRESSION: 1. Acute sigmoid diverticulitis with microperforation.  No abscess. 2. Hepatic steatosis. 3. Cholelithiasis. 4. Coronary artery, aortic valvular, and abdominal aortic calcific atherosclerosis. 5. Moderate prostate enlargement with coarse calcification. Electronically Signed   By: Kristine Garbe M.D.   On: 05/11/2017 22:32    Anti-infectives: Anti-infectives (From admission, onward)   Start     Dose/Rate Route Frequency Ordered Stop   05/12/17 0400  piperacillin-tazobactam (ZOSYN) IVPB 3.375 g     3.375 g 12.5 mL/hr over 240 Minutes Intravenous Every 8 hours 05/11/17 2304     05/11/17 2315  piperacillin-tazobactam (ZOSYN) IVPB 3.375 g  Status:  Discontinued     3.375 g 12.5 mL/hr over 240 Minutes Intravenous Every 8  hours 05/11/17 2312 05/12/17 0007   05/11/17  2300  piperacillin-tazobactam (ZOSYN) IVPB 3.375 g     3.375 g 100 mL/hr over 30 Minutes Intravenous  Once 05/11/17 2250 05/11/17 2340      Assessment/Plan:  66 year old male admitted with diverticulitis with a microperforation.  Much showing evidence of improvement on IV antibiotics.  Discussed that we would continue the IV antibiotics and bowel rest until his pain is pretty much completely resolved.  Encourage ambulation and incentive spirometer usage.  Once his pain is resolved he will be started on a diet and transition to oral antibiotics.  Azavier Creson T. Adonis Huguenin, MD, [facility] General Surgeon [facility]  Day [facility] [phone] Night [facility] [phone] 05/12/2017

## 2017-05-12 NOTE — Plan of Care (Signed)
Patient progressing with iv fluids.  Ross Reynolds

## 2017-05-12 NOTE — Progress Notes (Addendum)
Per Dr. Adonis Huguenin do not give oral meds as order is NPO except ice chips, even though oral meds were given over night. Per MD okay to place order for artifical tears per patient request.

## 2017-05-13 LAB — BASIC METABOLIC PANEL
Anion gap: 8 (ref 5–15)
BUN: 14 mg/dL (ref 6–20)
CHLORIDE: 107 mmol/L (ref 101–111)
CO2: 23 mmol/L (ref 22–32)
CREATININE: 0.88 mg/dL (ref 0.61–1.24)
Calcium: 8.5 mg/dL — ABNORMAL LOW (ref 8.9–10.3)
GFR calc Af Amer: >60 mL/min (ref >60)
GFR calc non Af Amer: >60 mL/min (ref >60)
Glucose, Bld: 78 mg/dL (ref 65–99)
Potassium: 3.7 mmol/L (ref 3.5–5.1)
Sodium: 138 mmol/L (ref 135–145)

## 2017-05-13 LAB — CBC
HEMATOCRIT: 40.7 % (ref 40.0–52.0)
HEMOGLOBIN: 13.6 g/dL (ref 13.0–18.0)
MCH: 30.1 pg (ref 26.0–34.0)
MCHC: 33.5 g/dL (ref 32.0–36.0)
MCV: 89.9 fL (ref 80.0–100.0)
Platelets: 229 10*3/uL (ref 150–440)
RBC: 4.53 MIL/uL (ref 4.40–5.90)
RDW: 13.6 % (ref 11.5–14.5)
WBC: 7.9 10*3/uL (ref 3.8–10.6)

## 2017-05-13 MED ORDER — ASPIRIN-ACETAMINOPHEN-CAFFEINE 250-250-65 MG PO TABS
1.0000 | ORAL_TABLET | Freq: Four times a day (QID) | ORAL | Status: DC | PRN
  Administered 2017-05-13: 1 via ORAL
  Filled 2017-05-13 (×2): qty 1

## 2017-05-13 MED ORDER — AMOXICILLIN-POT CLAVULANATE 875-125 MG PO TABS
1.0000 | ORAL_TABLET | Freq: Two times a day (BID) | ORAL | Status: DC
  Administered 2017-05-13 – 2017-05-14 (×3): 1 via ORAL
  Filled 2017-05-13 (×3): qty 1

## 2017-05-13 MED ORDER — ACETAMINOPHEN 500 MG PO TABS: 1000.0000 mg | ORAL_TABLET | Freq: Four times a day (QID) | ORAL | Status: DC | PRN

## 2017-05-13 NOTE — Progress Notes (Signed)
Per Dr. Adonis Huguenin okay to saline lock IV. Will discontinue maintenance fluid order.

## 2017-05-13 NOTE — Progress Notes (Signed)
CC: Diverticulitis Subjective: Patient reports continued improvement.  Still has some soreness on ambulation and movement in his left lower quadrant but much better than on admission.  States he is hungry.  Having normal bowel function.  Objective: Vital signs in last 24 hours: Temp:  [97.7 F (36.5 C)-98.1 F (36.7 C)] 97.8 F (36.6 C) (12/27 0347) Pulse Rate:  [64-82] 74 (12/27 0347) Resp:  [18] 18 (12/27 0347) BP: (117-138)/(71-78) 132/74 (12/27 0347) SpO2:  [95 %-99 %] 97 % (12/27 0347) Last BM Date: 05/12/17  Intake/Output from previous day: 12/26 0701 - 12/27 0700 In: 3588 [I.V.:3469; IV Piggyback:119] Out: 1875 [Urine:1875] Intake/Output this shift: Total I/O In: 241 [I.V.:220; IV Piggyback:21] Out: 300 [Urine:300]  Physical exam:  General: No acute distress   chest: Clear to auscultation  heart: Regular rate and rhythm Abdomen: Soft, nondistended, minimally tender to deep palpation in the left lower quadrant.  Lab Results: CBC  Recent Labs    05/12/17 0453 05/13/17 0757  WBC 11.4* 7.9  HGB 13.4 13.6  HCT 39.7* 40.7  PLT 231 229   BMET Recent Labs    05/12/17 0453 05/13/17 0757  NA 136 138  K 3.5 3.7  CL 110 107  CO2 22 23  GLUCOSE 92 78  BUN 15 14  CREATININE 0.83 0.88  CALCIUM 8.6* 8.5*   PT/INR No results for input(s): LABPROT, INR in the last 72 hours. ABG No results for input(s): PHART, HCO3 in the last 72 hours.  Invalid input(s): PCO2, PO2  Studies/Results: Ct Abdomen Pelvis W Contrast  Result Date: 05/11/2017 CLINICAL DATA:  66 y/o M; 66 y/o M; left lower quadrant pain intermittently for a few weeks. Now presenting with right lower quadrant pain and low-grade fever. EXAM: CT ABDOMEN AND PELVIS WITH CONTRAST TECHNIQUE: Multidetector CT imaging of the abdomen and pelvis was performed using the standard protocol following bolus administration of intravenous contrast. CONTRAST:  124mL ISOVUE-370 IOPAMIDOL (ISOVUE-370) INJECTION 76%  COMPARISON:  None. FINDINGS: Lower chest: Mild coronary artery and aortic valvular calcification. Hepatobiliary: Hepatic steatosis. Cholelithiasis. No focal liver lesion identified. No biliary ductal dilatation. Pancreas: Unremarkable. No pancreatic ductal dilatation or surrounding inflammatory changes. Spleen: Normal in size without focal abnormality. Adrenals/Urinary Tract: Adrenal glands are unremarkable. Kidneys are normal, without renal calculi, focal lesion, or hydronephrosis. Bladder is unremarkable. Stomach/Bowel: Acute sigmoid diverticulitis. Punctate focus of extraluminal air compatible with microperforation. No abscess. Normal appendix. No additional obstructive or inflammatory changes of stomach, small bowel, or large bowel. Vascular/Lymphatic: Aortic atherosclerosis. No enlarged abdominal or pelvic lymph nodes. Reproductive: Moderate prostate enlargement and coarse calcification. Other: No abdominal wall hernia or abnormality. No abdominopelvic ascites. Musculoskeletal: No acute or significant osseous findings. IMPRESSION: 1. Acute sigmoid diverticulitis with microperforation.  No abscess. 2. Hepatic steatosis. 3. Cholelithiasis. 4. Coronary artery, aortic valvular, and abdominal aortic calcific atherosclerosis. 5. Moderate prostate enlargement with coarse calcification. Electronically Signed   By: Kristine Garbe M.D.   On: 05/11/2017 22:32    Anti-infectives: Anti-infectives (From admission, onward)   Start     Dose/Rate Route Frequency Ordered Stop   05/13/17 1000  amoxicillin-clavulanate (AUGMENTIN) 875-125 MG per tablet 1 tablet     1 tablet Oral Every 12 hours 05/13/17 0913     05/12/17 0400  piperacillin-tazobactam (ZOSYN) IVPB 3.375 g  Status:  Discontinued     3.375 g 12.5 mL/hr over 240 Minutes Intravenous Every 8 hours 05/11/17 2304 05/13/17 0913   05/11/17 2315  piperacillin-tazobactam (ZOSYN) IVPB 3.375 g  Status:  Discontinued     3.375 g 12.5 mL/hr over 240 Minutes  Intravenous Every 8 hours 05/11/17 2312 05/12/17 0007   05/11/17 2300  piperacillin-tazobactam (ZOSYN) IVPB 3.375 g     3.375 g 100 mL/hr over 30 Minutes Intravenous  Once 05/11/17 2250 05/11/17 2340      Assessment/Plan:  66 year old male admitted with diverticulitis.  Much improved with normalization of his labs.  Plan to transition to oral medications and start a diet today.  Possible discharge home this afternoon depending on how he does during the day.  Encourage ambulation and incentive spirometer usage.  Reagyn Facemire T. Adonis Huguenin, MD, [facility] General Surgeon [facility]  Day [facility] [phone] Night [facility] [phone] 05/13/2017

## 2017-05-13 NOTE — Progress Notes (Signed)
Per Dr. Adonis Huguenin okay to make Tylenol PRN and place order for [place] per pharmacy. Also okay per MD for Pt to shower.

## 2017-05-14 MED ORDER — HYDROCODONE-ACETAMINOPHEN 5-300 MG PO TABS: 1.0000 | ORAL_TABLET | ORAL | 0 refills | Status: DC | PRN

## 2017-05-14 MED ORDER — AMOXICILLIN-POT CLAVULANATE 875-125 MG PO TABS: 1.0000 | ORAL_TABLET | Freq: Two times a day (BID) | ORAL | 1 refills | Status: DC

## 2017-05-14 NOTE — Care Management Important Message (Signed)
Important Message  Patient Details  Name: Ross Reynolds. MRN: [id number] Date of Birth: December 29, 1950   Medicare Important Message Given:  Yes    Beverly Sessions, RN 05/14/2017, 3:27 PM

## 2017-05-14 NOTE — Progress Notes (Signed)
Ross Reynolds.  A and O x 4. VSS. Pt tolerating diet well. No complaints of pain or nausea. IV removed intact, prescriptions given. Pt voiced understanding of discharge instructions with no further questions. Pt discharged via wheelchair with nurse aide.  Lynann Bologna MSN, RN-BC  Allergies as of 05/14/2017   No Known Allergies     Medication List    TAKE these medications   amoxicillin-clavulanate 875-125 MG tablet Commonly known as:  AUGMENTIN Take 1 tablet by mouth every 12 (twelve) hours.   fenofibrate 145 MG tablet Commonly known as:  TRICOR Take 145 mg by mouth daily.   Hydrocodone-Acetaminophen 5-300 MG Tabs Commonly known as:  VICODIN Take 1 tablet by mouth every 4 (four) hours as needed.   losartan-hydrochlorothiazide 50-12.5 MG tablet Commonly known as:  HYZAAR Take 1 tablet by mouth daily.   methocarbamol 500 MG tablet Commonly known as:  ROBAXIN Take 500 mg by mouth daily as needed for muscle spasms.   tamsulosin 0.4 MG Caps capsule Commonly known as:  FLOMAX Take 0.4 mg by mouth.       Vitals:   05/14/17 1019 05/14/17 1307  BP: 140/67 (!) 151/79  Pulse: 76 71  Resp:  20  Temp:  98.4 F (36.9 C)  SpO2:  100%

## 2017-05-14 NOTE — Discharge Instructions (Signed)
Soft diet Resume home medications Take oral antibiotics Follow up Dr Adonis Huguenin 10 days

## 2017-05-14 NOTE — Progress Notes (Signed)
CC: Left lower quadrant pain Subjective: This patient admitted with acute diverticulitis.  He states he is feeling much better still having minimal pain in fact he does not call it pain he calls it discomfort.  He points to the left lower quadrant.  No nausea vomiting he is tolerating clear liquids.  No fevers or chills  Objective: Vital signs in last 24 hours: Temp:  [97.4 F (36.3 C)-98.2 F (36.8 C)] 98.2 F (36.8 C) (12/28 0502) Pulse Rate:  [62-76] 76 (12/28 1019) Resp:  [18-20] 18 (12/28 0502) BP: (119-145)/(63-77) 140/67 (12/28 1019) SpO2:  [97 %-99 %] 97 % (12/28 0502) Last BM Date: 05/14/17  Intake/Output from previous day: 12/27 0701 - 12/28 0700 In: 1835 [P.O.:460; I.V.:1354; IV Piggyback:21] Out: 2025 [Urine:2025] Intake/Output this shift: Total I/O In: 360 [P.O.:360] Out: 625 [Urine:625]  Physical exam:  Vital signs are stable reviewed and afebrile.  Patient is up and moving around easily.  Abdomen is soft minimally tender in the left lower quadrant no guarding no rebound.  No icterus no jaundice  Lab Results: CBC  Recent Labs    05/12/17 0453 05/13/17 0757  WBC 11.4* 7.9  HGB 13.4 13.6  HCT 39.7* 40.7  PLT 231 229   BMET Recent Labs    05/12/17 0453 05/13/17 0757  NA 136 138  K 3.5 3.7  CL 110 107  CO2 22 23  GLUCOSE 92 78  BUN 15 14  CREATININE 0.83 0.88  CALCIUM 8.6* 8.5*   PT/INR No results for input(s): LABPROT, INR in the last 72 hours. ABG No results for input(s): PHART, HCO3 in the last 72 hours.  Invalid input(s): PCO2, PO2  Studies/Results: No results found.  Anti-infectives: Anti-infectives (From admission, onward)   Start     Dose/Rate Route Frequency Ordered Stop   05/14/17 0000  amoxicillin-clavulanate (AUGMENTIN) 875-125 MG tablet     1 tablet Oral Every 12 hours 05/14/17 0746     05/13/17 1000  amoxicillin-clavulanate (AUGMENTIN) 875-125 MG per tablet 1 tablet     1 tablet Oral Every 12 hours 05/13/17 0913     05/12/17 0400  piperacillin-tazobactam (ZOSYN) IVPB 3.375 g  Status:  Discontinued     3.375 g 12.5 mL/hr over 240 Minutes Intravenous Every 8 hours 05/11/17 2304 05/13/17 0913   05/11/17 2315  piperacillin-tazobactam (ZOSYN) IVPB 3.375 g  Status:  Discontinued     3.375 g 12.5 mL/hr over 240 Minutes Intravenous Every 8 hours 05/11/17 2312 05/12/17 0007   05/11/17 2300  piperacillin-tazobactam (ZOSYN) IVPB 3.375 g     3.375 g 100 mL/hr over 30 Minutes Intravenous  Once 05/11/17 2250 05/11/17 2340      Assessment/Plan:  Patient doing very well pillar.  Will advance diet and likely discharge later today.  He will follow-up with Dr. Lanetta Inch next week.  Florene Glen, MD, FACS  05/14/2017

## 2017-05-14 NOTE — Discharge Summary (Signed)
Physician Discharge Summary  Patient ID: Ross Reynolds. MRN: [id number] DOB/AGE: 68-May-1952 68 y.o.  Admit date: 05/11/2017 Discharge date: 05/14/2017   Discharge Diagnoses:  Active Problems:   Diverticulitis large intestine   Perforation of sigmoid colon due to diverticulitis   Procedures: None  Hospital Course: This patient admitted to the hospital with a diagnosis of acute diverticulitis.  He responded well to IV antibiotics.  He is tolerating a diet.  He feels well and is well enough to go home on oral antibiotics.  He will be given oral analgesics as well.  He may shower and resume his home medications to follow-up with Dr. Lanetta Inch next week.  Consults: None  Disposition: 01-Home or Self Care   Allergies as of 05/14/2017   No Known Allergies     Medication List    TAKE these medications   amoxicillin-clavulanate 875-125 MG tablet Commonly known as:  AUGMENTIN Take 1 tablet by mouth every 12 (twelve) hours.   fenofibrate 145 MG tablet Commonly known as:  TRICOR Take 145 mg by mouth daily.   Hydrocodone-Acetaminophen 5-300 MG Tabs Commonly known as:  VICODIN Take 1 tablet by mouth every 4 (four) hours as needed.   losartan-hydrochlorothiazide 50-12.5 MG tablet Commonly known as:  HYZAAR Take 1 tablet by mouth daily.   methocarbamol 500 MG tablet Commonly known as:  ROBAXIN Take 500 mg by mouth daily as needed for muscle spasms.   tamsulosin 0.4 MG Caps capsule Commonly known as:  FLOMAX Take 0.4 mg by mouth.      Follow-up Information    Olean Ree, MD. Go on 05/25/2017.   Specialty:  Surgery Why:  Tuesday at 11:00am for hospital follow-up Contact information: [place] [address] [phone]           Florene Glen, MD, FACS

## 2017-05-25 ENCOUNTER — Encounter: Payer: Self-pay | Admitting: Surgery

## 2017-05-25 ENCOUNTER — Ambulatory Visit (INDEPENDENT_AMBULATORY_CARE_PROVIDER_SITE_OTHER): Payer: PPO | Admitting: Surgery

## 2017-05-25 VITALS — BP 151/90 | HR 97 | Temp 97.2°F | Ht 71.0 in | Wt 190.5 lb

## 2017-05-25 DIAGNOSIS — K5720 Diverticulitis of large intestine with perforation and abscess without bleeding: Secondary | ICD-10-CM

## 2017-05-25 NOTE — Progress Notes (Signed)
05/25/2017  History of Present Illness: Ross Reynolds. is a 67 y.o. male recently admitted to the hospital with acute diverticulitis with microperforation but without abscess.  He was discharged on 12/28 on oral Augmentin and presents today for further follow up.  He reports that he had been doing well but last week had some pork chops and had some increased left lower quadrant pain for two days.  However, the pain was not as bad as before his hospitalization.  Otherwise his pain has improved.  He is staying with a softer bland diet for now and just finished his Augmentin this morning.  Denies any fevers, chills, chest pain, worsening abdominal pain.  Past Medical History: Past Medical History:  Diagnosis Date  . BPH (benign prostatic hypertrophy)   . Diverticulitis large intestine 05/11/2017  . Headache   . Hyperlipemia   . Hypertension   . Perforation of sigmoid colon due to diverticulitis   . Rectal bleeding      Past Surgical History: Past Surgical History:  Procedure Laterality Date  . COLONOSCOPY WITH PROPOFOL N/A 12/07/2014   Procedure: COLONOSCOPY WITH PROPOFOL;  Surgeon: Manya Silvas, MD;  Location: [facility] ENDOSCOPY;  Service: Endoscopy;  Laterality: N/A;  . FOOT SURGERY    . INGUINAL HERNIA REPAIR  2015  . NASAL SEPTUM SURGERY      Home Medications: Prior to Admission medications   Medication Sig Start Date End Date Taking? Authorizing Provider  fenofibrate (TRICOR) 145 MG tablet Take 145 mg by mouth daily.   Yes [provider]  losartan-hydrochlorothiazide (HYZAAR) 50-12.5 MG tablet Take 1 tablet by mouth daily.   Yes [provider]  methocarbamol (ROBAXIN) 500 MG tablet Take 500 mg by mouth daily as needed for muscle spasms.   Yes [provider]  tamsulosin (FLOMAX) 0.4 MG CAPS capsule Take 0.4 mg by mouth.   Yes [provider]    Allergies: No Known Allergies  Social History:  reports that  has never smoked. he has never  used smokeless tobacco. His alcohol and drug histories are not on file.   Family History: Family History  Problem Relation Age of Onset  . Lung cancer Father   . Brain cancer Father     Review of Systems: Review of Systems  Constitutional: Negative for chills and fever.  Respiratory: Negative for shortness of breath.   Cardiovascular: Negative for chest pain.  Gastrointestinal: Negative for abdominal pain, constipation, diarrhea, nausea and vomiting.    Physical Exam BP (!) 151/90   Pulse 97   Temp (!) 97.2 F (36.2 C) (Oral)   Ht 5\' 11"  (1.803 m)   Wt 86.4 kg (190 lb 8 oz)   BMI 26.57 kg/m  CONSTITUTIONAL: No acute distress RESPIRATORY:  Lungs are clear, and breath sounds are equal bilaterally. Normal respiratory effort without pathologic use of accessory muscles. CARDIOVASCULAR: Heart is regular without murmurs, gallops, or rubs. GI: The abdomen is soft, nondistended, nontender to palpation.  There is only some mild discomfort to palpation in the left lower quadrant that he qualifies as "ache" on palpation.  MUSCULOSKELETAL:  Normal muscle strength and tone in all four extremities.  No peripheral edema or cyanosis. NEUROLOGIC:  Motor and sensation is grossly normal.  Cranial nerves are grossly intact. PSYCH:  Alert and oriented to person, place and time. Affect is normal.  Labs/Imaging: None recently.  Assessment and Plan: This is a 67 y.o. male who presents for follow up after hospitalization for diverticulitis.  The  patient overall has improved despite of the two days of some worse pain.  Encouraged the patient to continue a soft bland diet for the remainder of this week and then increase to regular diet next week with higher fiber.  It'll be important to see how the patient does off antibiotics this week as well.  If his symptoms get any worse, he will contact us so we can give him a new course of antibiotics (though would be Cipro/Flagyl this time).  If he continues  doing well and improving, then there would be no need for more antibiotics.  He had a colonoscopy with Dr. Tiffany Kocher in 2016 which did show diverticulosis of the sigmoid colon, so another colonoscopy is not needed as this point.  Discussed with the patient that at this point there is also no urgency for pursuing sigmoidectomy, but we are always available if he wants to discuss surgery in the future.  The patient understands this plan and all of his questions have been answered.  Face-to-face time spent with the patient and care providers was 40 minutes, with more than 50% of the time spent counseling, educating, and coordinating care of the patient.     Melvyn Neth, [place]

## 2017-05-25 NOTE — Patient Instructions (Addendum)
  Continue with soft food diet the remainder of this week. You may advance your diet next week.  Please call our office with any questions or concerns.    Diverticulitis Diverticulitis is when small pockets in your large intestine (colon) get infected or swollen. This causes stomach pain and watery poop (diarrhea). These pouches are called diverticula. They form in people who have a condition called diverticulosis. Follow these instructions at home: Medicines  Take over-the-counter and prescription medicines only as told by your doctor. These include: ? Antibiotics. ? Pain medicines. ? Fiber pills. ? Probiotics. ? Stool softeners.  Do not drive or use heavy machinery while taking prescription pain medicine.  If you were prescribed an antibiotic, take it as told. Do not stop taking it even if you feel better. General instructions  Follow a diet as told by your doctor.  When you feel better, your doctor may tell you to change your diet. You may need to eat a lot of fiber. Fiber makes it easier to poop (have bowel movements). Healthy foods with fiber include: ? Berries. ? Beans. ? Lentils. ? Green vegetables.  Exercise 3 or more times a week. Aim for 30 minutes each time. Exercise enough to sweat and make your heart beat faster.  Keep all follow-up visits as told. This is important. You may need to have an exam of the large intestine. This is called a colonoscopy. Contact a doctor if:  Your pain does not get better.  You have a hard time eating or drinking.  You are not pooping like normal. Get help right away if:  Your pain gets worse.  Your problems do not get better.  Your problems get worse very fast.  You have a fever.  You throw up (vomit) more than one time.  You have poop that is: ? Bloody. ? Black. ? Tarry. Summary  Diverticulitis is when small pockets in your large intestine (colon) get infected or swollen.  Take medicines only as told by your  doctor.  Follow a diet as told by your doctor. This information is not intended to replace advice given to you by your health care provider. Make sure you discuss any questions you have with your health care provider. Document Released: 10/21/2007 Document Revised: 05/21/2016 Document Reviewed: 05/21/2016 Elsevier Interactive Patient Education  2017 [employer].

## 2017-12-24 DIAGNOSIS — E781 Pure hyperglyceridemia: Secondary | ICD-10-CM | POA: Diagnosis not present

## 2017-12-24 DIAGNOSIS — Z125 Encounter for screening for malignant neoplasm of prostate: Secondary | ICD-10-CM | POA: Diagnosis not present

## 2017-12-24 DIAGNOSIS — Z79899 Other long term (current) drug therapy: Secondary | ICD-10-CM | POA: Diagnosis not present

## 2017-12-27 DIAGNOSIS — Z0000 Encounter for general adult medical examination without abnormal findings: Secondary | ICD-10-CM | POA: Diagnosis not present

## 2017-12-27 DIAGNOSIS — G44229 Chronic tension-type headache, not intractable: Secondary | ICD-10-CM | POA: Diagnosis not present

## 2018-02-04 DIAGNOSIS — J069 Acute upper respiratory infection, unspecified: Secondary | ICD-10-CM | POA: Diagnosis not present

## 2018-02-23 ENCOUNTER — Other Ambulatory Visit: Payer: Self-pay | Admitting: Neurology

## 2018-02-23 DIAGNOSIS — G44221 Chronic tension-type headache, intractable: Secondary | ICD-10-CM

## 2018-02-23 DIAGNOSIS — R0683 Snoring: Secondary | ICD-10-CM | POA: Diagnosis not present

## 2018-03-11 ENCOUNTER — Ambulatory Visit
Admission: RE | Admit: 2018-03-11 | Discharge: 2018-03-11 | Disposition: A | Discharge status: Home / Self Care | Payer: PPO | Source: Ambulatory Visit | Attending: Neurology | Admitting: Neurology

## 2018-03-11 DIAGNOSIS — J341 Cyst and mucocele of nose and nasal sinus: Secondary | ICD-10-CM | POA: Diagnosis not present

## 2018-03-11 DIAGNOSIS — G44221 Chronic tension-type headache, intractable: Secondary | ICD-10-CM | POA: Diagnosis not present

## 2018-03-11 DIAGNOSIS — R51 Headache: Secondary | ICD-10-CM | POA: Diagnosis not present

## 2018-03-11 LAB — POCT I-STAT CREATININE: Creatinine, Ser: 1.1 mg/dL (ref 0.61–1.24)

## 2018-03-11 MED ORDER — GADOBUTROL 1 MMOL/ML IV SOLN
8.5000 mL | Freq: Once | INTRAVENOUS | Status: AC | PRN
  Administered 2018-03-11: 8.5 mL via INTRAVENOUS

## 2018-05-03 DIAGNOSIS — M9903 Segmental and somatic dysfunction of lumbar region: Secondary | ICD-10-CM | POA: Diagnosis not present

## 2018-05-03 DIAGNOSIS — S39012A Strain of muscle, fascia and tendon of lower back, initial encounter: Secondary | ICD-10-CM | POA: Diagnosis not present

## 2018-05-25 DIAGNOSIS — H6123 Impacted cerumen, bilateral: Secondary | ICD-10-CM | POA: Diagnosis not present

## 2018-05-25 DIAGNOSIS — H903 Sensorineural hearing loss, bilateral: Secondary | ICD-10-CM | POA: Diagnosis not present

## 2018-05-25 DIAGNOSIS — H6060 Unspecified chronic otitis externa, unspecified ear: Secondary | ICD-10-CM | POA: Diagnosis not present

## 2018-05-30 DIAGNOSIS — E559 Vitamin D deficiency, unspecified: Secondary | ICD-10-CM | POA: Diagnosis not present

## 2018-06-14 DIAGNOSIS — M5441 Lumbago with sciatica, right side: Secondary | ICD-10-CM | POA: Diagnosis not present

## 2018-06-14 DIAGNOSIS — G44221 Chronic tension-type headache, intractable: Secondary | ICD-10-CM | POA: Diagnosis not present

## 2018-06-14 DIAGNOSIS — G8929 Other chronic pain: Secondary | ICD-10-CM | POA: Diagnosis not present

## 2018-06-14 DIAGNOSIS — R635 Abnormal weight gain: Secondary | ICD-10-CM | POA: Diagnosis not present

## 2018-06-14 DIAGNOSIS — E559 Vitamin D deficiency, unspecified: Secondary | ICD-10-CM | POA: Diagnosis not present

## 2018-06-14 DIAGNOSIS — M5442 Lumbago with sciatica, left side: Secondary | ICD-10-CM | POA: Diagnosis not present

## 2018-06-14 DIAGNOSIS — R0683 Snoring: Secondary | ICD-10-CM | POA: Diagnosis not present

## 2018-06-17 DIAGNOSIS — I499 Cardiac arrhythmia, unspecified: Secondary | ICD-10-CM | POA: Diagnosis not present

## 2018-06-17 DIAGNOSIS — M545 Low back pain: Secondary | ICD-10-CM | POA: Diagnosis not present

## 2018-06-17 DIAGNOSIS — G4730 Sleep apnea, unspecified: Secondary | ICD-10-CM | POA: Diagnosis not present

## 2018-10-21 DIAGNOSIS — H2513 Age-related nuclear cataract, bilateral: Secondary | ICD-10-CM | POA: Diagnosis not present

## 2018-11-28 ENCOUNTER — Other Ambulatory Visit: Payer: Self-pay | Admitting: Student

## 2018-11-28 ENCOUNTER — Other Ambulatory Visit: Payer: Self-pay

## 2018-11-28 ENCOUNTER — Ambulatory Visit
Admission: RE | Admit: 2018-11-28 | Discharge: 2018-11-28 | Disposition: A | Discharge status: Home / Self Care | Payer: PPO | Source: Ambulatory Visit | Attending: Student | Admitting: Student

## 2018-11-28 DIAGNOSIS — R1032 Left lower quadrant pain: Secondary | ICD-10-CM

## 2018-11-28 DIAGNOSIS — Z8719 Personal history of other diseases of the digestive system: Secondary | ICD-10-CM | POA: Insufficient documentation

## 2018-11-28 DIAGNOSIS — R109 Unspecified abdominal pain: Secondary | ICD-10-CM | POA: Diagnosis not present

## 2018-11-28 LAB — POCT I-STAT CREATININE: Creatinine, Ser: 1.2 mg/dL (ref 0.61–1.24)

## 2018-11-28 MED ORDER — IOHEXOL 300 MG/ML  SOLN
100.0000 mL | Freq: Once | INTRAMUSCULAR | Status: AC | PRN
  Administered 2018-11-28: 15:00:00 100 mL via INTRAVENOUS

## 2018-12-01 DIAGNOSIS — K760 Fatty (change of) liver, not elsewhere classified: Secondary | ICD-10-CM | POA: Diagnosis not present

## 2018-12-01 DIAGNOSIS — K5732 Diverticulitis of large intestine without perforation or abscess without bleeding: Secondary | ICD-10-CM | POA: Diagnosis not present

## 2018-12-01 DIAGNOSIS — K219 Gastro-esophageal reflux disease without esophagitis: Secondary | ICD-10-CM | POA: Diagnosis not present

## 2018-12-01 DIAGNOSIS — Z01812 Encounter for preprocedural laboratory examination: Secondary | ICD-10-CM | POA: Diagnosis not present

## 2018-12-01 DIAGNOSIS — R748 Abnormal levels of other serum enzymes: Secondary | ICD-10-CM | POA: Diagnosis not present

## 2019-01-17 DIAGNOSIS — M9903 Segmental and somatic dysfunction of lumbar region: Secondary | ICD-10-CM | POA: Diagnosis not present

## 2019-01-17 DIAGNOSIS — M5136 Other intervertebral disc degeneration, lumbar region: Secondary | ICD-10-CM | POA: Diagnosis not present

## 2019-02-01 ENCOUNTER — Other Ambulatory Visit: Payer: Self-pay

## 2019-02-01 DIAGNOSIS — R6889 Other general symptoms and signs: Secondary | ICD-10-CM | POA: Diagnosis not present

## 2019-02-01 DIAGNOSIS — Z20822 Contact with and (suspected) exposure to covid-19: Secondary | ICD-10-CM

## 2019-02-02 LAB — NOVEL CORONAVIRUS, NAA: SARS-CoV-2, NAA: NOT DETECTED

## 2019-02-16 DIAGNOSIS — Z79899 Other long term (current) drug therapy: Secondary | ICD-10-CM | POA: Diagnosis not present

## 2019-02-16 DIAGNOSIS — Z125 Encounter for screening for malignant neoplasm of prostate: Secondary | ICD-10-CM | POA: Diagnosis not present

## 2019-02-16 DIAGNOSIS — E7800 Pure hypercholesterolemia, unspecified: Secondary | ICD-10-CM | POA: Diagnosis not present

## 2019-02-17 ENCOUNTER — Other Ambulatory Visit: Payer: PPO

## 2019-02-20 ENCOUNTER — Other Ambulatory Visit
Admission: RE | Admit: 2019-02-20 | Discharge: 2019-02-20 | Disposition: A | Discharge status: Home / Self Care | Payer: PPO | Source: Ambulatory Visit | Attending: Internal Medicine | Admitting: Internal Medicine

## 2019-02-20 DIAGNOSIS — Z20828 Contact with and (suspected) exposure to other viral communicable diseases: Secondary | ICD-10-CM | POA: Diagnosis not present

## 2019-02-20 DIAGNOSIS — Z01812 Encounter for preprocedural laboratory examination: Secondary | ICD-10-CM | POA: Insufficient documentation

## 2019-02-20 LAB — SARS CORONAVIRUS 2 (TAT 6-24 HRS): SARS Coronavirus 2: NEGATIVE

## 2019-02-22 ENCOUNTER — Encounter: Payer: Self-pay | Admitting: *Deleted

## 2019-02-22 ENCOUNTER — Ambulatory Visit: Payer: PPO | Admitting: Certified Registered Nurse Anesthetist

## 2019-02-22 ENCOUNTER — Ambulatory Visit
Admission: RE | Admit: 2019-02-22 | Discharge: 2019-02-22 | Disposition: A | Discharge status: Home / Self Care | Payer: PPO | Attending: Internal Medicine | Admitting: Internal Medicine

## 2019-02-22 ENCOUNTER — Other Ambulatory Visit: Payer: Self-pay

## 2019-02-22 ENCOUNTER — Encounter
Admission: RE | Disposition: A | Discharge status: Home / Self Care | Payer: Self-pay | Source: Home / Self Care | Attending: Internal Medicine

## 2019-02-22 DIAGNOSIS — K640 First degree hemorrhoids: Secondary | ICD-10-CM | POA: Insufficient documentation

## 2019-02-22 DIAGNOSIS — R933 Abnormal findings on diagnostic imaging of other parts of digestive tract: Secondary | ICD-10-CM | POA: Diagnosis not present

## 2019-02-22 DIAGNOSIS — K219 Gastro-esophageal reflux disease without esophagitis: Secondary | ICD-10-CM | POA: Diagnosis not present

## 2019-02-22 DIAGNOSIS — Z86010 Personal history of colonic polyps: Secondary | ICD-10-CM | POA: Diagnosis not present

## 2019-02-22 DIAGNOSIS — N400 Enlarged prostate without lower urinary tract symptoms: Secondary | ICD-10-CM | POA: Insufficient documentation

## 2019-02-22 DIAGNOSIS — K5792 Diverticulitis of intestine, part unspecified, without perforation or abscess without bleeding: Secondary | ICD-10-CM | POA: Diagnosis not present

## 2019-02-22 DIAGNOSIS — K5790 Diverticulosis of intestine, part unspecified, without perforation or abscess without bleeding: Secondary | ICD-10-CM | POA: Diagnosis not present

## 2019-02-22 DIAGNOSIS — Z09 Encounter for follow-up examination after completed treatment for conditions other than malignant neoplasm: Secondary | ICD-10-CM | POA: Diagnosis not present

## 2019-02-22 DIAGNOSIS — E785 Hyperlipidemia, unspecified: Secondary | ICD-10-CM | POA: Diagnosis not present

## 2019-02-22 DIAGNOSIS — K5730 Diverticulosis of large intestine without perforation or abscess without bleeding: Secondary | ICD-10-CM | POA: Insufficient documentation

## 2019-02-22 DIAGNOSIS — K648 Other hemorrhoids: Secondary | ICD-10-CM | POA: Diagnosis not present

## 2019-02-22 DIAGNOSIS — Z79899 Other long term (current) drug therapy: Secondary | ICD-10-CM | POA: Insufficient documentation

## 2019-02-22 DIAGNOSIS — I10 Essential (primary) hypertension: Secondary | ICD-10-CM | POA: Diagnosis not present

## 2019-02-22 DIAGNOSIS — R948 Abnormal results of function studies of other organs and systems: Secondary | ICD-10-CM | POA: Diagnosis not present

## 2019-02-22 HISTORY — DX: Gastro-esophageal reflux disease without esophagitis: K21.9

## 2019-02-22 HISTORY — PX: COLONOSCOPY WITH PROPOFOL: SHX5780

## 2019-02-22 SURGERY — COLONOSCOPY WITH PROPOFOL
Anesthesia: General

## 2019-02-22 MED ORDER — PROPOFOL 500 MG/50ML IV EMUL
INTRAVENOUS | Status: DC | PRN
  Administered 2019-02-22: 130 ug/kg/min via INTRAVENOUS

## 2019-02-22 MED ORDER — PROPOFOL 10 MG/ML IV BOLUS
INTRAVENOUS | Status: DC | PRN
  Administered 2019-02-22: 17 mg via INTRAVENOUS
  Administered 2019-02-22: 80 mg via INTRAVENOUS

## 2019-02-22 MED ORDER — LIDOCAINE HCL (PF) 2 % IJ SOLN
INTRAMUSCULAR | Status: AC
  Filled 2019-02-22: qty 10

## 2019-02-22 MED ORDER — PHENYLEPHRINE HCL (PRESSORS) 10 MG/ML IV SOLN
INTRAVENOUS | Status: DC | PRN
  Administered 2019-02-22: 100 ug via INTRAVENOUS

## 2019-02-22 MED ORDER — SODIUM CHLORIDE 0.9 % IV SOLN
INTRAVENOUS | Status: DC
  Administered 2019-02-22: 08:00:00 via INTRAVENOUS

## 2019-02-22 MED ORDER — PROPOFOL 500 MG/50ML IV EMUL
INTRAVENOUS | Status: AC
  Filled 2019-02-22: qty 50

## 2019-02-22 MED ORDER — LIDOCAINE HCL (CARDIAC) PF 100 MG/5ML IV SOSY
PREFILLED_SYRINGE | INTRAVENOUS | Status: DC | PRN
  Administered 2019-02-22: 50 mg via INTRAVENOUS

## 2019-02-22 NOTE — Anesthesia Preprocedure Evaluation (Signed)
Anesthesia Evaluation  Patient identified by MRN, date of birth, ID band Patient awake    Reviewed: Allergy & Precautions, H&P , NPO status , Patient's Chart, lab work & pertinent test results, reviewed documented beta blocker date and time   Airway Mallampati: II  TM Distance: >3 FB Neck ROM: full    Dental  (+) Dental Advidsory Given, Implants, Teeth Intact   Pulmonary neg pulmonary ROS,    Pulmonary exam normal        Cardiovascular Exercise Tolerance: Good hypertension, (-) angina(-) Past MI and (-) Cardiac Stents Normal cardiovascular exam(-) dysrhythmias (-) Valvular Problems/Murmurs     Neuro/Psych  Headaches, neg Seizures negative psych ROS   GI/Hepatic Neg liver ROS, GERD  ,  Endo/Other  negative endocrine ROS  Renal/GU negative Renal ROS  negative genitourinary   Musculoskeletal   Abdominal   Peds  Hematology negative hematology ROS (+)   Anesthesia Other Findings Past Medical History: No date: BPH (benign prostatic hypertrophy) 05/11/2017: Diverticulitis large intestine No date: GERD (gastroesophageal reflux disease) No date: Headache No date: Hyperlipemia No date: Hypertension No date: Perforation of sigmoid colon due to diverticulitis No date: Rectal bleeding   Reproductive/Obstetrics negative OB ROS                             Anesthesia Physical Anesthesia Plan  ASA: II  Anesthesia Plan: General   Post-op Pain Management:    Induction: Intravenous  PONV Risk Score and Plan: 2 and Propofol infusion and TIVA  Airway Management Planned: Natural Airway and Nasal Cannula  Additional Equipment:   Intra-op Plan:   Post-operative Plan:   Informed Consent: I have reviewed the patients History and Physical, chart, labs and discussed the procedure including the risks, benefits and alternatives for the proposed anesthesia with the patient or authorized representative  who has indicated his/her understanding and acceptance.     Dental Advisory Given  Plan Discussed with: Anesthesiologist, CRNA and Surgeon  Anesthesia Plan Comments:         Anesthesia Quick Evaluation

## 2019-02-22 NOTE — Anesthesia Postprocedure Evaluation (Signed)
Anesthesia Post Note  Patient: Ross Reynolds.  Procedure(s) Performed: COLONOSCOPY WITH PROPOFOL (N/A )  Patient location during evaluation: Endoscopy Anesthesia Type: General Level of consciousness: awake and alert Pain management: pain level controlled Vital Signs Assessment: post-procedure vital signs reviewed and stable Respiratory status: spontaneous breathing, nonlabored ventilation, respiratory function stable and patient connected to nasal cannula oxygen Cardiovascular status: blood pressure returned to baseline and stable Postop Assessment: no apparent nausea or vomiting Anesthetic complications: no     Last Vitals:  Vitals:   02/22/19 0938 02/22/19 0948  BP: 114/81 128/84  Pulse: 78 75  Resp: (!) 27 17  Temp:    SpO2: 95% 97%    Last Pain:  Vitals:   02/22/19 0948  TempSrc:   PainSc: 0-No pain                 Martha Clan

## 2019-02-22 NOTE — H&P (Signed)
Outpatient short stay form Pre-procedure 02/22/2019 8:34 AM Indie Boehne K. Alice Reichert, M.D.  Primary Physician: Derinda Late, M.D.  Reason for visit:  Personal hx of adenomatous colon polyps, f/u diverticulitis, abnormal GI tract imaging  History of present illness:  68 y/o male with hx of colon polyps presents for f/u diverticulitis with CT imaging showing sigmoid diverticulitis with wall thickening. Currently, Patient denies change in bowel habits, rectal bleeding, weight loss or abdominal pain.      Current Facility-Administered Medications:  .  0.9 %  sodium chloride infusion, , Intravenous, Continuous, Matthews, Benay Pike, MD, Last Rate: 20 mL/hr at 02/22/19 0825  Medications Prior to Admission  Medication Sig Dispense Refill Last Dose  . fenofibrate (TRICOR) 145 MG tablet Take 145 mg by mouth daily.   02/21/2019 at Unknown time  . losartan-hydrochlorothiazide (HYZAAR) 50-12.5 MG tablet Take 1 tablet by mouth daily.   02/22/2019 at Unknown time  . metroNIDAZOLE (FLAGYL) 500 MG tablet Take 500 mg by mouth 3 (three) times daily.     Marland Kitchen omeprazole (PRILOSEC) 20 MG capsule Take 20 mg by mouth daily.   Past Week at Unknown time  . traZODone (DESYREL) 50 MG tablet Take 50 mg by mouth at bedtime.     . methocarbamol (ROBAXIN) 500 MG tablet Take 500 mg by mouth daily as needed for muscle spasms.     . tamsulosin (FLOMAX) 0.4 MG CAPS capsule Take 0.4 mg by mouth.        No Known Allergies   Past Medical History:  Diagnosis Date  . BPH (benign prostatic hypertrophy)   . Diverticulitis large intestine 05/11/2017  . GERD (gastroesophageal reflux disease)   . Headache   . Hyperlipemia   . Hypertension   . Perforation of sigmoid colon due to diverticulitis   . Rectal bleeding     Review of systems:  Otherwise negative.    Physical Exam  Gen: Alert, oriented. Appears stated age.  HEENT: [place]/AT. PERRLA. Lungs: CTA, no wheezes. CV: RR nl S1, S2. Abd: soft, benign, no masses. BS+ Ext: No  edema. Pulses 2+    Planned procedures: Proceed with colonoscopy. The patient understands the nature of the planned procedure, indications, risks, alternatives and potential complications including but not limited to bleeding, infection, perforation, damage to internal organs and possible oversedation/side effects from anesthesia. The patient agrees and gives consent to proceed.  Please refer to procedure notes for findings, recommendations and patient disposition/instructions.     Braylon Lemmons K. Alice Reichert, M.D. Gastroenterology 02/22/2019  8:34 AM

## 2019-02-22 NOTE — Op Note (Signed)
[facility] Gastroenterology Patient Name: Ross Reynolds Procedure Date: 02/22/2019 8:51 AM MRN: [id number] Account #: [id number] Date of Birth: 08/01/1950 Admit Type: Outpatient Age: 68 Room: [facility] ENDO ROOM 3 Gender: Male Note Status: Finalized Procedure:            Colonoscopy Indications:          Abnormal CT of the GI tract, Follow-up of diverticulitis Providers:            Benay Pike. Akari Crysler MD, MD Medicines:            Propofol per Anesthesia Complications:        No immediate complications. Procedure:            Pre-Anesthesia Assessment:                       - The risks and benefits of the procedure and the                        sedation options and risks were discussed with the                        patient. All questions were answered and informed                        consent was obtained.                       - Patient identification and proposed procedure were                        verified prior to the procedure by the nurse. The                        procedure was verified in the procedure room.                       - ASA Grade Assessment: III - A patient with severe                        systemic disease.                       - After reviewing the risks and benefits, the patient                        was deemed in satisfactory condition to undergo the                        procedure.                       After obtaining informed consent, the colonoscope was                        passed under direct vision. Throughout the procedure,                        the patient's blood pressure, pulse, and oxygen                        saturations were monitored continuously. The  Colonoscope was introduced through the anus and                        advanced to the the cecum, identified by appendiceal                        orifice and ileocecal valve. The colonoscopy was                        performed without difficulty.  The patient tolerated the                        procedure well. The quality of the bowel preparation                        was excellent. The ileocecal valve, appendiceal                        orifice, and rectum were photographed. Findings:      The perianal and digital rectal examinations were normal. Pertinent       negatives include normal sphincter tone and no palpable rectal lesions.      Many small and large-mouthed diverticula were found in the left colon.      Non-bleeding internal hemorrhoids were found during retroflexion. The       hemorrhoids were Grade I (internal hemorrhoids that do not prolapse).      The exam was otherwise without abnormality. Impression:           - Diverticulosis in the left colon.                       - Non-bleeding internal hemorrhoids.                       - The examination was otherwise normal.                       - No specimens collected. Recommendation:       - Patient has a contact number available for                        emergencies. The signs and symptoms of potential                        delayed complications were discussed with the patient.                        Return to normal activities tomorrow. Written discharge                        instructions were provided to the patient.                       - Resume previous diet.                       - Continue present medications.                       - Repeat colonoscopy in 5 years for surveillance.                       -  Return to GI office PRN.                       - The findings and recommendations were discussed with                        the patient. Procedure Code(s):    --- Professional ---                       [phone], Colonoscopy, flexible; diagnostic, including                        collection of specimen(s) by brushing or washing, when                        performed (separate procedure) Diagnosis Code(s):    --- Professional ---                       R93.3,  Abnormal findings on diagnostic imaging of other                        parts of digestive tract                       K57.30, Diverticulosis of large intestine without                        perforation or abscess without bleeding                       K57.32, Diverticulitis of large intestine without                        perforation or abscess without bleeding                       K64.0, First degree hemorrhoids CPT copyright 2019 American Medical Association. All rights reserved. The codes documented in this report are preliminary and upon coder review may  be revised to meet current compliance requirements. Efrain Sella MD, MD 02/22/2019 9:18:32 AM This report has been signed electronically. Number of Addenda: 0 Note Initiated On: 02/22/2019 8:51 AM Scope Withdrawal Time: 0 hours 6 minutes 24 seconds  Total Procedure Duration: 0 hours 9 minutes 40 seconds  Estimated Blood Loss: Estimated blood loss: none.      [facility]

## 2019-02-22 NOTE — Transfer of Care (Signed)
Immediate Anesthesia Transfer of Care Note  Patient: Ross Reynolds.  Procedure(s) Performed: COLONOSCOPY WITH PROPOFOL (N/A )  Patient Location: PACU and Endoscopy Unit  Anesthesia Type:General  Level of Consciousness: drowsy  Airway & Oxygen Therapy: Patient Spontanous Breathing  Post-op Assessment: Report given to RN and Post -op Vital signs reviewed and stable  Post vital signs: Reviewed and stable  Last Vitals:  Vitals Value Taken Time  BP 92/64 02/22/19 0918  Temp 36.3 C 02/22/19 0918  Pulse 88 02/22/19 0918  Resp 16 02/22/19 0918  SpO2 94 % 02/22/19 0918    Last Pain:  Vitals:   02/22/19 0918  TempSrc: Tympanic  PainSc: Asleep         Complications: No apparent anesthesia complications

## 2019-02-22 NOTE — Interval H&P Note (Signed)
History and Physical Interval Note:  02/22/2019 8:37 AM  Ross Reynolds.  has presented today for surgery, with the diagnosis of DIVERTICULITIS.  The various methods of treatment have been discussed with the patient and family. After consideration of risks, benefits and other options for treatment, the patient has consented to  Procedure(s): COLONOSCOPY WITH PROPOFOL (N/A) as a surgical intervention.  The patient's history has been reviewed, patient examined, no change in status, stable for surgery.  I have reviewed the patient's chart and labs.  Questions were answered to the patient's satisfaction.     [place], [place]

## 2019-02-22 NOTE — Anesthesia Post-op Follow-up Note (Signed)
Anesthesia QCDR form completed.        

## 2019-02-23 ENCOUNTER — Encounter: Payer: Self-pay | Admitting: Internal Medicine

## 2019-02-24 DIAGNOSIS — Z1331 Encounter for screening for depression: Secondary | ICD-10-CM | POA: Diagnosis not present

## 2019-02-24 DIAGNOSIS — Z0000 Encounter for general adult medical examination without abnormal findings: Secondary | ICD-10-CM | POA: Diagnosis not present

## 2019-05-04 ENCOUNTER — Other Ambulatory Visit: Payer: Self-pay

## 2019-05-04 ENCOUNTER — Ambulatory Visit: Payer: PPO | Attending: Internal Medicine

## 2019-05-04 DIAGNOSIS — Z20828 Contact with and (suspected) exposure to other viral communicable diseases: Secondary | ICD-10-CM | POA: Diagnosis not present

## 2019-05-04 DIAGNOSIS — Z20822 Contact with and (suspected) exposure to covid-19: Secondary | ICD-10-CM

## 2019-05-05 LAB — NOVEL CORONAVIRUS, NAA: SARS-CoV-2, NAA: NOT DETECTED

## 2019-05-31 ENCOUNTER — Ambulatory Visit: Payer: PPO | Attending: Internal Medicine

## 2019-05-31 DIAGNOSIS — Z20822 Contact with and (suspected) exposure to covid-19: Secondary | ICD-10-CM

## 2019-06-01 LAB — NOVEL CORONAVIRUS, NAA: SARS-CoV-2, NAA: DETECTED — AB

## 2019-06-02 DIAGNOSIS — U071 COVID-19: Secondary | ICD-10-CM | POA: Diagnosis not present

## 2019-06-02 DIAGNOSIS — J208 Acute bronchitis due to other specified organisms: Secondary | ICD-10-CM | POA: Diagnosis not present

## 2019-07-07 DIAGNOSIS — G4489 Other headache syndrome: Secondary | ICD-10-CM | POA: Diagnosis not present

## 2019-07-07 DIAGNOSIS — J019 Acute sinusitis, unspecified: Secondary | ICD-10-CM | POA: Diagnosis not present

## 2019-08-07 ENCOUNTER — Ambulatory Visit: Payer: PPO | Attending: Internal Medicine

## 2019-08-07 DIAGNOSIS — Z23 Encounter for immunization: Secondary | ICD-10-CM

## 2019-08-07 NOTE — Progress Notes (Signed)
   [id number] Vaccination Clinic  Name:  Ross Reynolds.    MRN: [id number] DOB: 1951/02/01  08/07/2019  [name] was observed post Covid-19 immunization for 15 minutes without incident. He was provided with Vaccine Information Sheet and instruction to access the V-Safe system.   [name] was instructed to call 911 with any severe reactions post vaccine: Marland Kitchen Difficulty breathing  . Swelling of face and throat  . A fast heartbeat  . A bad rash all over body  . Dizziness and weakness   Immunizations Administered    Name Date Dose VIS Date Route   Pfizer COVID-19 Vaccine 08/07/2019 12:37 PM 0.3 mL 04/28/2019 Intramuscular   Manufacturer: [place]   Lot: [id number]   [place]: [id number]

## 2019-08-17 DIAGNOSIS — E7800 Pure hypercholesterolemia, unspecified: Secondary | ICD-10-CM | POA: Diagnosis not present

## 2019-08-17 DIAGNOSIS — Z79899 Other long term (current) drug therapy: Secondary | ICD-10-CM | POA: Diagnosis not present

## 2019-08-17 IMAGING — MR MR HEAD WO/W CM
12 series · 48 of 48 positions shown · IV contrast (gadavist)
Comparison: None.

CLINICAL DATA: Headache for several years duration.

EXAM:
MRI HEAD WITHOUT AND WITH CONTRAST
TECHNIQUE: Multiplanar, multiecho pulse sequences of the brain and surrounding
structures were obtained without and with intravenous contrast.
CONTRAST:  8.5 cc Gadavist

[Series 2: T1 · sagittal · 5.0mm · 0.45mm/px · 1 of 23 slices shown (1 of 2)]
[im 1/23]
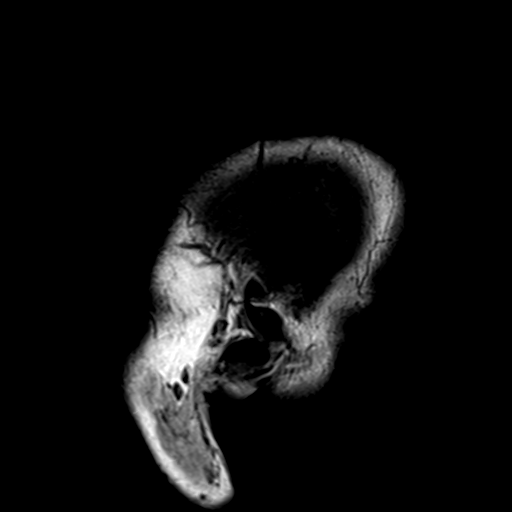

[Series 4: DWI · axial · 3.0mm · 1.20mm/px · z∈[-59,+101]mm · 3 of 55 slices shown (1 of 4)]
[im 1/55]
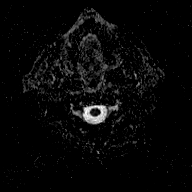
[im 28/55]
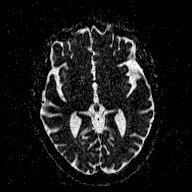
[im 55/55]
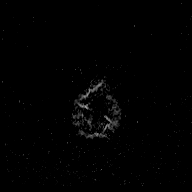

[Series 6: DWI · coronal · 3.0mm · 1.15mm/px · 3 of 46 slices shown (2 of 4)]
[im 1/46]
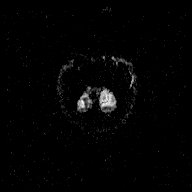
[im 23/46]
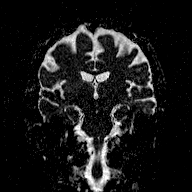
[im 46/46]
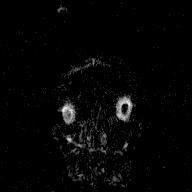

[Series 7: T2 · axial · 5.0mm · 0.72mm/px · z∈[-50,+101]mm · 2 of 23 slices shown (1 of 2)]
[im 1/23]
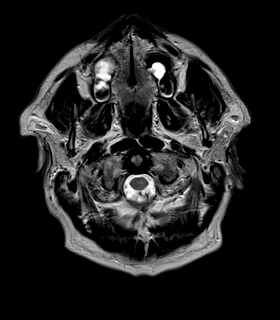
[im 23/23]
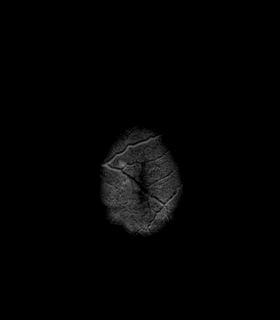

[Series 8: FLAIR · axial · 3.0mm · 0.45mm/px · z∈[-54,+105]mm · 4 of 55 slices shown]
[im 1/55]
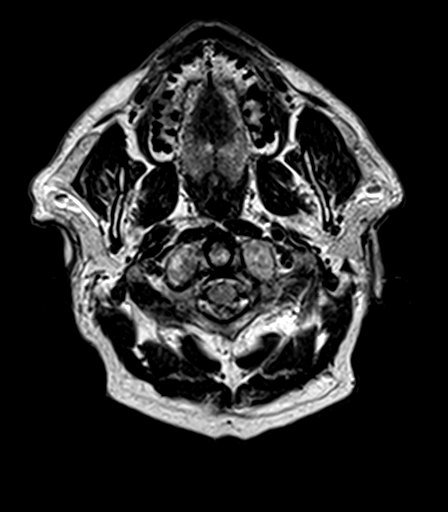
[im 19/55]
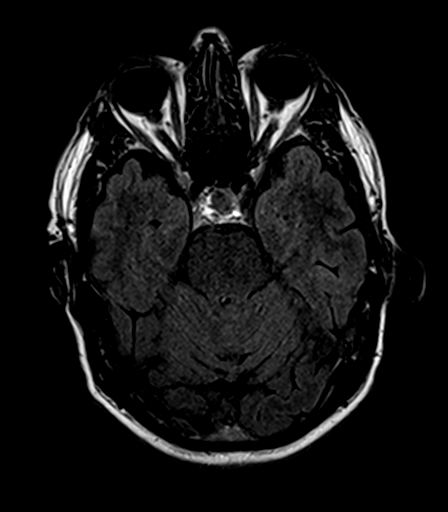
[im 37/55]
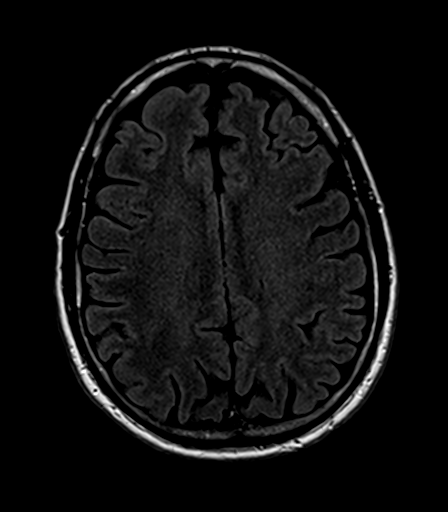
[im 55/55]
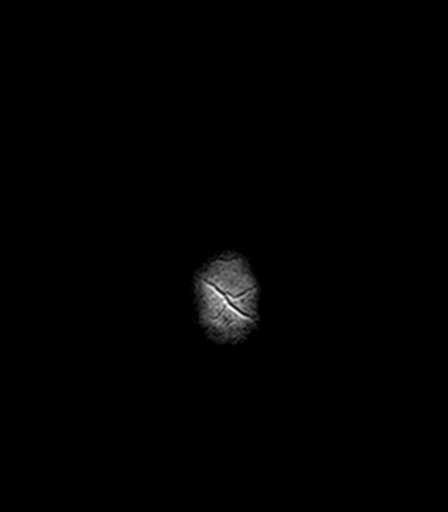

[Series 9: T2 · axial · 5.0mm · 0.72mm/px · z∈[-50,+101]mm · 2 of 23 slices shown (2 of 2)]
[im 1/23]
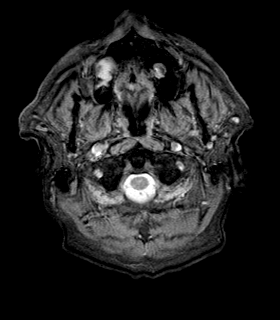
[im 23/23]
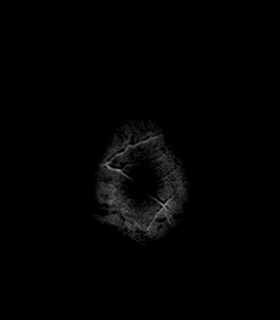

[Series 10: T1 · axial · 1.0mm · 1.00mm/px · z∈[-50,+106]mm · 11 of 160 slices shown (2 of 2)]
[im 1/160]
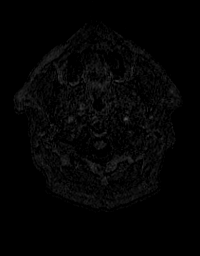
[im 16/160]
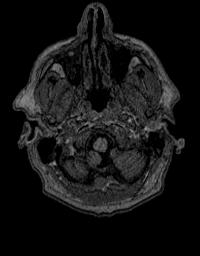
[im 32/160]
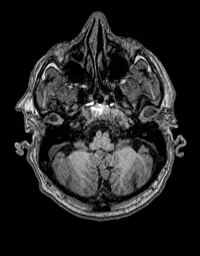
[im 48/160]
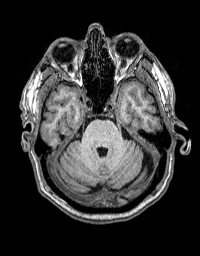
[im 64/160]
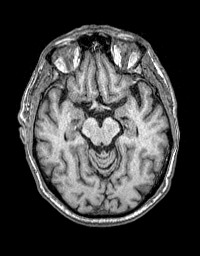
[im 80/160]
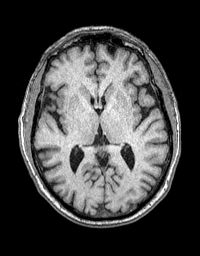
[im 96/160]
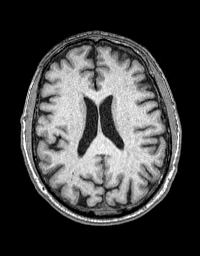
[im 112/160]
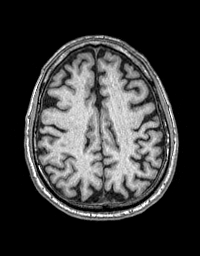
[im 128/160]
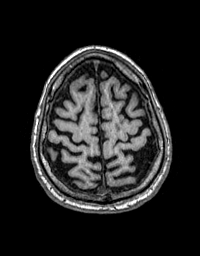
[im 144/160]
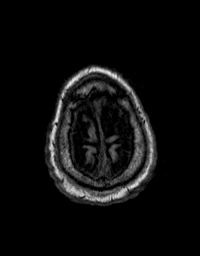
[im 160/160]
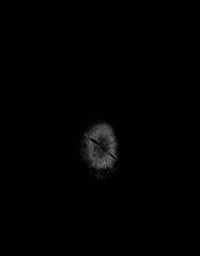

[Series 11: T2 post-contrast · coronal · 5.0mm · 0.43mm/px · 2 of 31 slices shown]
[im 1/31]
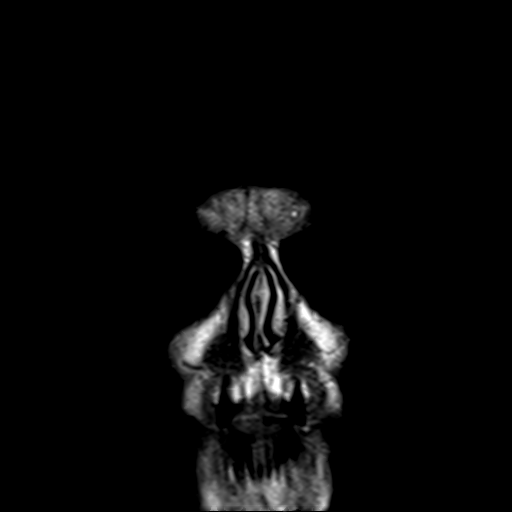
[im 31/31]
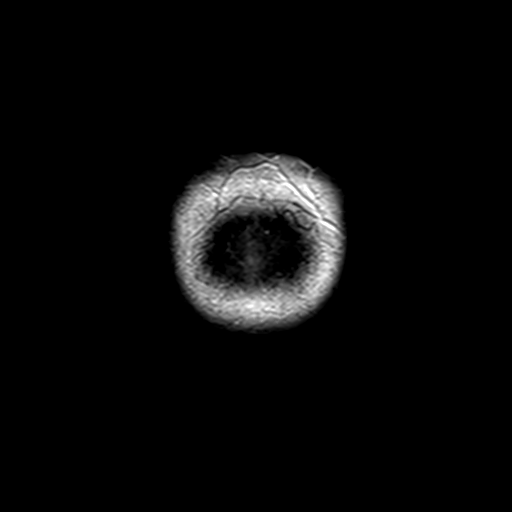

[Series 12: T1 post-contrast · axial · 1.0mm · 1.00mm/px · z∈[-50,+106]mm · 11 of 160 slices shown (1 of 2)]
[im 1/160]
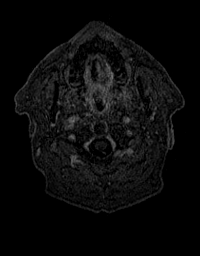
[im 16/160]
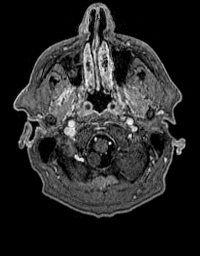
[im 32/160]
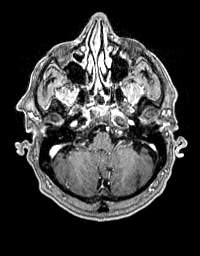
[im 48/160]
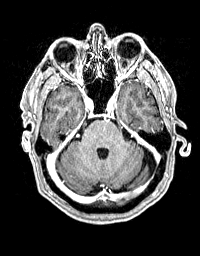
[im 64/160]
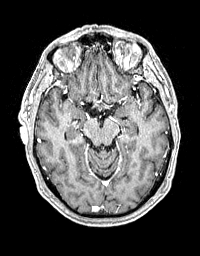
[im 80/160]
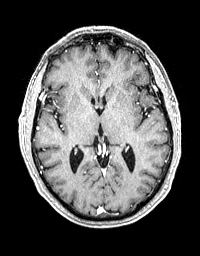
[im 96/160]
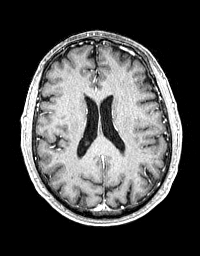
[im 112/160]
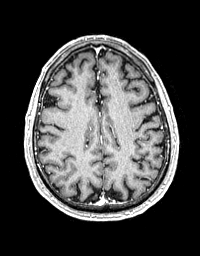
[im 128/160]
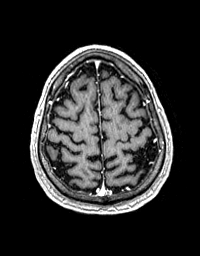
[im 144/160]
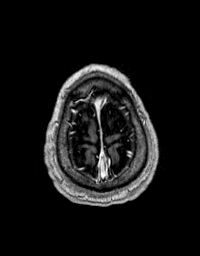
[im 160/160]
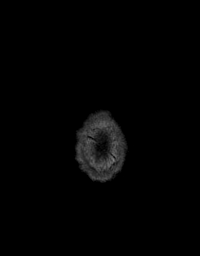

[Series 13: T1 post-contrast · coronal · 5.0mm · 0.43mm/px · 2 of 31 slices shown (2 of 2)]
[im 1/31]
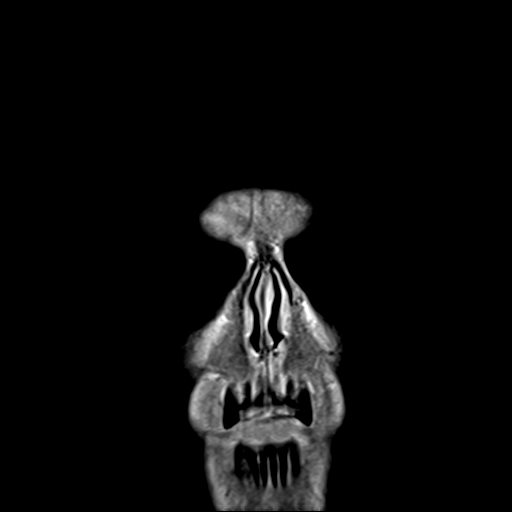
[im 31/31]
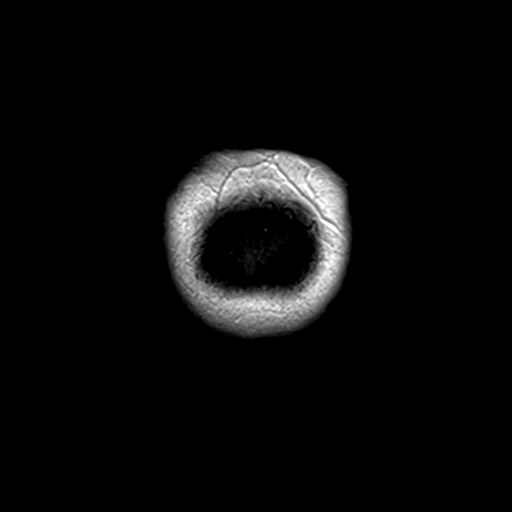

[Series 100: DWI · axial · 3.0mm · 1.20mm/px · z∈[-59,+101]mm · 4 of 55 slices shown (3 of 4)]
[im 1/55]
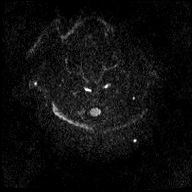
[im 19/55]
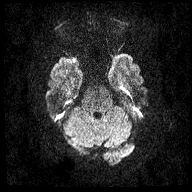
[im 37/55]
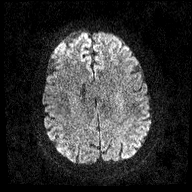
[im 55/55]
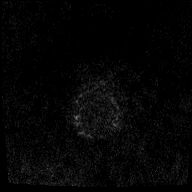

[Series 101: DWI · coronal · 3.0mm · 1.15mm/px · 3 of 47 slices shown (4 of 4)]
[im 1/47]
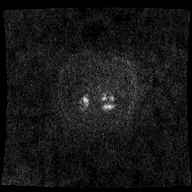
[im 24/47]
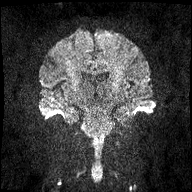
[im 47/47]
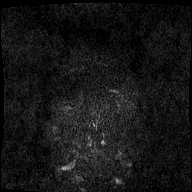

[48 of 48 positions shown; findings below may reference images not displayed]

FINDINGS: Brain: Mild generalized brain volume loss. No evidence of old or
acute small or large vessel infarction, mass lesion, hemorrhage,
hydrocephalus or extra-axial collection. After contrast
administration, no abnormal enhancement occurs.

Vascular: Major vessels at the base of the brain show flow.

Skull and upper cervical spine: Negative

Sinuses/Orbits: Retention cyst of the maxillary sinuses right more
than left. Usually, these are incidental. The other paranasal
sinuses are clear.

Other: None
IMPRESSION: No cause of headache identified. No focal or acute brain finding.
The patient does have some retention cysts of the maxillary sinuses
right more than left, but these are usually asymptomatic.

## 2019-08-25 DIAGNOSIS — N401 Enlarged prostate with lower urinary tract symptoms: Secondary | ICD-10-CM | POA: Diagnosis not present

## 2019-08-25 DIAGNOSIS — I10 Essential (primary) hypertension: Secondary | ICD-10-CM | POA: Diagnosis not present

## 2019-08-25 DIAGNOSIS — K760 Fatty (change of) liver, not elsewhere classified: Secondary | ICD-10-CM | POA: Diagnosis not present

## 2019-08-25 DIAGNOSIS — Z79899 Other long term (current) drug therapy: Secondary | ICD-10-CM | POA: Diagnosis not present

## 2019-08-25 DIAGNOSIS — G4719 Other hypersomnia: Secondary | ICD-10-CM | POA: Diagnosis not present

## 2019-08-25 DIAGNOSIS — R5383 Other fatigue: Secondary | ICD-10-CM | POA: Diagnosis not present

## 2019-08-25 DIAGNOSIS — R519 Headache, unspecified: Secondary | ICD-10-CM | POA: Diagnosis not present

## 2019-08-25 DIAGNOSIS — Z125 Encounter for screening for malignant neoplasm of prostate: Secondary | ICD-10-CM | POA: Diagnosis not present

## 2019-08-25 DIAGNOSIS — E782 Mixed hyperlipidemia: Secondary | ICD-10-CM | POA: Diagnosis not present

## 2019-08-30 ENCOUNTER — Ambulatory Visit: Payer: PPO | Attending: Internal Medicine

## 2019-08-30 DIAGNOSIS — Z23 Encounter for immunization: Secondary | ICD-10-CM

## 2019-08-30 NOTE — Progress Notes (Signed)
   [id number] Vaccination Clinic  Name:  Ross Reynolds.    MRN: [id number] DOB: [birth year]  08/30/2019  [name] was observed post Covid-19 immunization for 15 minutes without incident. He was provided with Vaccine Information Sheet and instruction to access the V-Safe system.   [name] was instructed to call 911 with any severe reactions post vaccine: Marland Kitchen Difficulty breathing  . Swelling of face and throat  . A fast heartbeat  . A bad rash all over body  . Dizziness and weakness   Immunizations Administered    Name Date Dose VIS Date Route   Pfizer COVID-19 Vaccine 08/30/2019  3:27 PM 0.3 mL 04/28/2019 Intramuscular   Manufacturer: [place]   Lot: [id number]   [place]: [id number]

## 2019-10-07 DIAGNOSIS — G4733 Obstructive sleep apnea (adult) (pediatric): Secondary | ICD-10-CM | POA: Diagnosis not present

## 2019-10-27 DIAGNOSIS — H2513 Age-related nuclear cataract, bilateral: Secondary | ICD-10-CM | POA: Diagnosis not present

## 2019-10-30 DIAGNOSIS — G4733 Obstructive sleep apnea (adult) (pediatric): Secondary | ICD-10-CM | POA: Diagnosis not present

## 2019-11-07 DIAGNOSIS — H903 Sensorineural hearing loss, bilateral: Secondary | ICD-10-CM | POA: Diagnosis not present

## 2019-11-07 DIAGNOSIS — H6123 Impacted cerumen, bilateral: Secondary | ICD-10-CM | POA: Diagnosis not present

## 2019-11-29 DIAGNOSIS — G4733 Obstructive sleep apnea (adult) (pediatric): Secondary | ICD-10-CM | POA: Diagnosis not present

## 2019-12-29 DIAGNOSIS — G4733 Obstructive sleep apnea (adult) (pediatric): Secondary | ICD-10-CM | POA: Diagnosis not present

## 2019-12-29 DIAGNOSIS — Z9989 Dependence on other enabling machines and devices: Secondary | ICD-10-CM | POA: Diagnosis not present

## 2019-12-30 DIAGNOSIS — G4733 Obstructive sleep apnea (adult) (pediatric): Secondary | ICD-10-CM | POA: Diagnosis not present

## 2020-01-01 DIAGNOSIS — R1032 Left lower quadrant pain: Secondary | ICD-10-CM | POA: Diagnosis not present

## 2020-01-02 DIAGNOSIS — K219 Gastro-esophageal reflux disease without esophagitis: Secondary | ICD-10-CM | POA: Diagnosis not present

## 2020-01-02 DIAGNOSIS — K5792 Diverticulitis of intestine, part unspecified, without perforation or abscess without bleeding: Secondary | ICD-10-CM | POA: Diagnosis not present

## 2020-01-09 DIAGNOSIS — D170 Benign lipomatous neoplasm of skin and subcutaneous tissue of head, face and neck: Secondary | ICD-10-CM | POA: Diagnosis not present

## 2020-01-15 ENCOUNTER — Other Ambulatory Visit: Payer: Self-pay | Admitting: Student

## 2020-01-15 DIAGNOSIS — R1032 Left lower quadrant pain: Secondary | ICD-10-CM

## 2020-01-15 DIAGNOSIS — Z8719 Personal history of other diseases of the digestive system: Secondary | ICD-10-CM

## 2020-01-16 ENCOUNTER — Other Ambulatory Visit: Payer: Self-pay

## 2020-01-16 ENCOUNTER — Ambulatory Visit
Admission: RE | Admit: 2020-01-16 | Discharge: 2020-01-16 | Disposition: A | Discharge status: Home / Self Care | Payer: PPO | Source: Ambulatory Visit | Attending: Student | Admitting: Student

## 2020-01-16 DIAGNOSIS — I700 Atherosclerosis of aorta: Secondary | ICD-10-CM | POA: Diagnosis not present

## 2020-01-16 DIAGNOSIS — Z8719 Personal history of other diseases of the digestive system: Secondary | ICD-10-CM | POA: Diagnosis not present

## 2020-01-16 DIAGNOSIS — K5732 Diverticulitis of large intestine without perforation or abscess without bleeding: Secondary | ICD-10-CM | POA: Diagnosis not present

## 2020-01-16 DIAGNOSIS — R1032 Left lower quadrant pain: Secondary | ICD-10-CM | POA: Diagnosis not present

## 2020-01-16 DIAGNOSIS — N400 Enlarged prostate without lower urinary tract symptoms: Secondary | ICD-10-CM | POA: Diagnosis not present

## 2020-01-16 DIAGNOSIS — K8020 Calculus of gallbladder without cholecystitis without obstruction: Secondary | ICD-10-CM | POA: Diagnosis not present

## 2020-01-16 MED ORDER — IOHEXOL 300 MG/ML  SOLN
100.0000 mL | Freq: Once | INTRAMUSCULAR | Status: AC | PRN
  Administered 2020-01-16: 100 mL via INTRAVENOUS

## 2020-02-20 DIAGNOSIS — Z79899 Other long term (current) drug therapy: Secondary | ICD-10-CM | POA: Diagnosis not present

## 2020-02-20 DIAGNOSIS — R5383 Other fatigue: Secondary | ICD-10-CM | POA: Diagnosis not present

## 2020-02-20 DIAGNOSIS — E782 Mixed hyperlipidemia: Secondary | ICD-10-CM | POA: Diagnosis not present

## 2020-02-20 DIAGNOSIS — Z125 Encounter for screening for malignant neoplasm of prostate: Secondary | ICD-10-CM | POA: Diagnosis not present

## 2020-02-26 DIAGNOSIS — Z1331 Encounter for screening for depression: Secondary | ICD-10-CM | POA: Diagnosis not present

## 2020-02-26 DIAGNOSIS — Z23 Encounter for immunization: Secondary | ICD-10-CM | POA: Diagnosis not present

## 2020-02-26 DIAGNOSIS — Z0000 Encounter for general adult medical examination without abnormal findings: Secondary | ICD-10-CM | POA: Diagnosis not present

## 2020-03-14 DIAGNOSIS — J069 Acute upper respiratory infection, unspecified: Secondary | ICD-10-CM | POA: Diagnosis not present

## 2020-04-05 DIAGNOSIS — H9311 Tinnitus, right ear: Secondary | ICD-10-CM | POA: Diagnosis not present

## 2020-04-05 DIAGNOSIS — H6123 Impacted cerumen, bilateral: Secondary | ICD-10-CM | POA: Diagnosis not present

## 2020-04-05 DIAGNOSIS — H9319 Tinnitus, unspecified ear: Secondary | ICD-10-CM | POA: Diagnosis not present

## 2020-05-16 DIAGNOSIS — R059 Cough, unspecified: Secondary | ICD-10-CM | POA: Diagnosis not present

## 2020-05-31 DIAGNOSIS — G4733 Obstructive sleep apnea (adult) (pediatric): Secondary | ICD-10-CM | POA: Diagnosis not present

## 2020-06-20 DIAGNOSIS — Q6689 Other  specified congenital deformities of feet: Secondary | ICD-10-CM | POA: Diagnosis not present

## 2020-06-20 DIAGNOSIS — M898X9 Other specified disorders of bone, unspecified site: Secondary | ICD-10-CM | POA: Diagnosis not present

## 2020-06-20 DIAGNOSIS — M778 Other enthesopathies, not elsewhere classified: Secondary | ICD-10-CM | POA: Diagnosis not present

## 2020-07-01 DIAGNOSIS — G4733 Obstructive sleep apnea (adult) (pediatric): Secondary | ICD-10-CM | POA: Diagnosis not present

## 2020-07-29 DIAGNOSIS — G4733 Obstructive sleep apnea (adult) (pediatric): Secondary | ICD-10-CM | POA: Diagnosis not present

## 2020-08-19 DIAGNOSIS — E782 Mixed hyperlipidemia: Secondary | ICD-10-CM | POA: Diagnosis not present

## 2020-08-19 DIAGNOSIS — Z79899 Other long term (current) drug therapy: Secondary | ICD-10-CM | POA: Diagnosis not present

## 2020-08-26 DIAGNOSIS — Z79899 Other long term (current) drug therapy: Secondary | ICD-10-CM | POA: Diagnosis not present

## 2020-08-26 DIAGNOSIS — Z125 Encounter for screening for malignant neoplasm of prostate: Secondary | ICD-10-CM | POA: Diagnosis not present

## 2020-08-26 DIAGNOSIS — E7800 Pure hypercholesterolemia, unspecified: Secondary | ICD-10-CM | POA: Diagnosis not present

## 2020-08-26 DIAGNOSIS — G4733 Obstructive sleep apnea (adult) (pediatric): Secondary | ICD-10-CM | POA: Diagnosis not present

## 2020-08-26 DIAGNOSIS — K219 Gastro-esophageal reflux disease without esophagitis: Secondary | ICD-10-CM | POA: Diagnosis not present

## 2020-08-26 DIAGNOSIS — Z9989 Dependence on other enabling machines and devices: Secondary | ICD-10-CM | POA: Diagnosis not present

## 2020-08-26 DIAGNOSIS — E559 Vitamin D deficiency, unspecified: Secondary | ICD-10-CM | POA: Diagnosis not present

## 2020-08-26 DIAGNOSIS — I10 Essential (primary) hypertension: Secondary | ICD-10-CM | POA: Diagnosis not present

## 2020-08-27 DIAGNOSIS — G4733 Obstructive sleep apnea (adult) (pediatric): Secondary | ICD-10-CM | POA: Diagnosis not present

## 2020-09-16 DIAGNOSIS — J029 Acute pharyngitis, unspecified: Secondary | ICD-10-CM | POA: Diagnosis not present

## 2020-09-28 DIAGNOSIS — G4733 Obstructive sleep apnea (adult) (pediatric): Secondary | ICD-10-CM | POA: Diagnosis not present

## 2020-10-29 DIAGNOSIS — G4733 Obstructive sleep apnea (adult) (pediatric): Secondary | ICD-10-CM | POA: Diagnosis not present

## 2020-11-15 DIAGNOSIS — H2513 Age-related nuclear cataract, bilateral: Secondary | ICD-10-CM | POA: Diagnosis not present

## 2020-11-28 DIAGNOSIS — G4733 Obstructive sleep apnea (adult) (pediatric): Secondary | ICD-10-CM | POA: Diagnosis not present

## 2020-12-29 DIAGNOSIS — G4733 Obstructive sleep apnea (adult) (pediatric): Secondary | ICD-10-CM | POA: Diagnosis not present

## 2021-01-29 DIAGNOSIS — G4733 Obstructive sleep apnea (adult) (pediatric): Secondary | ICD-10-CM | POA: Diagnosis not present

## 2021-02-28 DIAGNOSIS — G4733 Obstructive sleep apnea (adult) (pediatric): Secondary | ICD-10-CM | POA: Diagnosis not present

## 2021-03-11 DIAGNOSIS — G4733 Obstructive sleep apnea (adult) (pediatric): Secondary | ICD-10-CM | POA: Diagnosis not present

## 2021-03-31 DIAGNOSIS — G4733 Obstructive sleep apnea (adult) (pediatric): Secondary | ICD-10-CM | POA: Diagnosis not present

## 2021-04-11 DIAGNOSIS — G4733 Obstructive sleep apnea (adult) (pediatric): Secondary | ICD-10-CM | POA: Diagnosis not present

## 2022-06-19 DIAGNOSIS — H2513 Age-related nuclear cataract, bilateral: Secondary | ICD-10-CM | POA: Diagnosis not present

## 2022-09-04 DIAGNOSIS — Z79899 Other long term (current) drug therapy: Secondary | ICD-10-CM | POA: Diagnosis not present

## 2022-09-04 DIAGNOSIS — Z125 Encounter for screening for malignant neoplasm of prostate: Secondary | ICD-10-CM | POA: Diagnosis not present

## 2022-09-04 DIAGNOSIS — E782 Mixed hyperlipidemia: Secondary | ICD-10-CM | POA: Diagnosis not present

## 2022-09-11 DIAGNOSIS — Z1331 Encounter for screening for depression: Secondary | ICD-10-CM | POA: Diagnosis not present

## 2022-09-11 DIAGNOSIS — Z0000 Encounter for general adult medical examination without abnormal findings: Secondary | ICD-10-CM | POA: Diagnosis not present

## 2022-09-11 DIAGNOSIS — Z79899 Other long term (current) drug therapy: Secondary | ICD-10-CM | POA: Diagnosis not present

## 2022-10-16 DIAGNOSIS — U071 COVID-19: Secondary | ICD-10-CM | POA: Diagnosis not present

## 2023-01-12 DIAGNOSIS — L814 Other melanin hyperpigmentation: Secondary | ICD-10-CM | POA: Diagnosis not present

## 2023-01-12 DIAGNOSIS — L570 Actinic keratosis: Secondary | ICD-10-CM | POA: Diagnosis not present

## 2023-01-12 DIAGNOSIS — L821 Other seborrheic keratosis: Secondary | ICD-10-CM | POA: Diagnosis not present

## 2023-01-12 DIAGNOSIS — Z85828 Personal history of other malignant neoplasm of skin: Secondary | ICD-10-CM | POA: Diagnosis not present

## 2023-01-12 DIAGNOSIS — Z08 Encounter for follow-up examination after completed treatment for malignant neoplasm: Secondary | ICD-10-CM | POA: Diagnosis not present

## 2023-01-21 DIAGNOSIS — J209 Acute bronchitis, unspecified: Secondary | ICD-10-CM | POA: Diagnosis not present

## 2023-03-05 DIAGNOSIS — Z79899 Other long term (current) drug therapy: Secondary | ICD-10-CM | POA: Diagnosis not present

## 2023-03-05 DIAGNOSIS — E782 Mixed hyperlipidemia: Secondary | ICD-10-CM | POA: Diagnosis not present

## 2023-03-12 DIAGNOSIS — N401 Enlarged prostate with lower urinary tract symptoms: Secondary | ICD-10-CM | POA: Diagnosis not present

## 2023-03-12 DIAGNOSIS — Z79899 Other long term (current) drug therapy: Secondary | ICD-10-CM | POA: Diagnosis not present

## 2023-03-12 DIAGNOSIS — J069 Acute upper respiratory infection, unspecified: Secondary | ICD-10-CM | POA: Diagnosis not present

## 2023-03-12 DIAGNOSIS — I10 Essential (primary) hypertension: Secondary | ICD-10-CM | POA: Diagnosis not present

## 2023-03-12 DIAGNOSIS — Z125 Encounter for screening for malignant neoplasm of prostate: Secondary | ICD-10-CM | POA: Diagnosis not present

## 2023-03-12 DIAGNOSIS — F5101 Primary insomnia: Secondary | ICD-10-CM | POA: Diagnosis not present

## 2023-03-12 DIAGNOSIS — E782 Mixed hyperlipidemia: Secondary | ICD-10-CM | POA: Diagnosis not present

## 2023-03-12 DIAGNOSIS — R351 Nocturia: Secondary | ICD-10-CM | POA: Diagnosis not present

## 2023-03-12 DIAGNOSIS — K219 Gastro-esophageal reflux disease without esophagitis: Secondary | ICD-10-CM | POA: Diagnosis not present

## 2023-03-17 ENCOUNTER — Ambulatory Visit
Admission: RE | Admit: 2023-03-17 | Discharge: 2023-03-17 | Disposition: A | Discharge status: Home / Self Care | Payer: PPO | Source: Ambulatory Visit | Attending: Gastroenterology | Admitting: Gastroenterology

## 2023-03-17 ENCOUNTER — Other Ambulatory Visit: Payer: Self-pay | Admitting: Gastroenterology

## 2023-03-17 DIAGNOSIS — K8020 Calculus of gallbladder without cholecystitis without obstruction: Secondary | ICD-10-CM | POA: Diagnosis not present

## 2023-03-17 DIAGNOSIS — R1032 Left lower quadrant pain: Secondary | ICD-10-CM | POA: Insufficient documentation

## 2023-03-17 DIAGNOSIS — N400 Enlarged prostate without lower urinary tract symptoms: Secondary | ICD-10-CM | POA: Diagnosis not present

## 2023-03-17 DIAGNOSIS — K5792 Diverticulitis of intestine, part unspecified, without perforation or abscess without bleeding: Secondary | ICD-10-CM | POA: Insufficient documentation

## 2023-03-17 DIAGNOSIS — K5732 Diverticulitis of large intestine without perforation or abscess without bleeding: Secondary | ICD-10-CM | POA: Diagnosis not present

## 2023-03-17 MED ORDER — IOHEXOL 300 MG/ML  SOLN
100.0000 mL | Freq: Once | INTRAMUSCULAR | Status: AC | PRN
  Administered 2023-03-17: 100 mL via INTRAVENOUS

## 2023-04-05 DIAGNOSIS — H6123 Impacted cerumen, bilateral: Secondary | ICD-10-CM | POA: Diagnosis not present

## 2023-04-05 DIAGNOSIS — H900 Conductive hearing loss, bilateral: Secondary | ICD-10-CM | POA: Diagnosis not present

## 2023-04-05 DIAGNOSIS — H6983 Other specified disorders of Eustachian tube, bilateral: Secondary | ICD-10-CM | POA: Diagnosis not present

## 2023-05-06 DIAGNOSIS — K5792 Diverticulitis of intestine, part unspecified, without perforation or abscess without bleeding: Secondary | ICD-10-CM | POA: Diagnosis not present

## 2023-06-18 ENCOUNTER — Ambulatory Visit (INDEPENDENT_AMBULATORY_CARE_PROVIDER_SITE_OTHER): Payer: PPO

## 2023-06-18 DIAGNOSIS — K5792 Diverticulitis of intestine, part unspecified, without perforation or abscess without bleeding: Secondary | ICD-10-CM | POA: Diagnosis not present

## 2023-06-25 DIAGNOSIS — H2513 Age-related nuclear cataract, bilateral: Secondary | ICD-10-CM | POA: Diagnosis not present

## 2023-09-04 DIAGNOSIS — Z03818 Encounter for observation for suspected exposure to other biological agents ruled out: Secondary | ICD-10-CM | POA: Diagnosis not present

## 2023-09-04 DIAGNOSIS — B9689 Other specified bacterial agents as the cause of diseases classified elsewhere: Secondary | ICD-10-CM | POA: Diagnosis not present

## 2023-09-04 DIAGNOSIS — J019 Acute sinusitis, unspecified: Secondary | ICD-10-CM | POA: Diagnosis not present

## 2023-09-04 DIAGNOSIS — J302 Other seasonal allergic rhinitis: Secondary | ICD-10-CM | POA: Diagnosis not present

## 2023-09-10 DIAGNOSIS — Z79899 Other long term (current) drug therapy: Secondary | ICD-10-CM | POA: Diagnosis not present

## 2023-09-10 DIAGNOSIS — Z125 Encounter for screening for malignant neoplasm of prostate: Secondary | ICD-10-CM | POA: Diagnosis not present

## 2023-09-10 DIAGNOSIS — E782 Mixed hyperlipidemia: Secondary | ICD-10-CM | POA: Diagnosis not present

## 2023-09-17 DIAGNOSIS — E782 Mixed hyperlipidemia: Secondary | ICD-10-CM | POA: Diagnosis not present

## 2023-09-17 DIAGNOSIS — Z125 Encounter for screening for malignant neoplasm of prostate: Secondary | ICD-10-CM | POA: Diagnosis not present

## 2023-09-17 DIAGNOSIS — Z1331 Encounter for screening for depression: Secondary | ICD-10-CM | POA: Diagnosis not present

## 2023-09-17 DIAGNOSIS — R351 Nocturia: Secondary | ICD-10-CM | POA: Diagnosis not present

## 2023-09-17 DIAGNOSIS — F5101 Primary insomnia: Secondary | ICD-10-CM | POA: Diagnosis not present

## 2023-09-17 DIAGNOSIS — R972 Elevated prostate specific antigen [PSA]: Secondary | ICD-10-CM | POA: Diagnosis not present

## 2023-09-17 DIAGNOSIS — Z79899 Other long term (current) drug therapy: Secondary | ICD-10-CM | POA: Diagnosis not present

## 2023-09-17 DIAGNOSIS — N401 Enlarged prostate with lower urinary tract symptoms: Secondary | ICD-10-CM | POA: Diagnosis not present

## 2023-09-17 DIAGNOSIS — I10 Essential (primary) hypertension: Secondary | ICD-10-CM | POA: Diagnosis not present

## 2023-09-17 DIAGNOSIS — Z0000 Encounter for general adult medical examination without abnormal findings: Secondary | ICD-10-CM | POA: Diagnosis not present

## 2023-10-22 DIAGNOSIS — Z79899 Other long term (current) drug therapy: Secondary | ICD-10-CM | POA: Diagnosis not present

## 2023-10-22 DIAGNOSIS — Z8719 Personal history of other diseases of the digestive system: Secondary | ICD-10-CM | POA: Diagnosis not present

## 2023-10-22 DIAGNOSIS — F419 Anxiety disorder, unspecified: Secondary | ICD-10-CM | POA: Diagnosis not present

## 2023-11-11 DIAGNOSIS — K5792 Diverticulitis of intestine, part unspecified, without perforation or abscess without bleeding: Secondary | ICD-10-CM | POA: Diagnosis not present

## 2023-11-11 DIAGNOSIS — R1032 Left lower quadrant pain: Secondary | ICD-10-CM | POA: Diagnosis not present

## 2023-11-12 ENCOUNTER — Other Ambulatory Visit: Payer: Self-pay | Admitting: Gastroenterology

## 2023-11-12 DIAGNOSIS — R1032 Left lower quadrant pain: Secondary | ICD-10-CM

## 2023-11-15 ENCOUNTER — Ambulatory Visit
Admission: RE | Admit: 2023-11-15 | Discharge: 2023-11-15 | Disposition: A | Discharge status: Home / Self Care | Source: Ambulatory Visit | Attending: Gastroenterology | Admitting: Gastroenterology

## 2023-11-15 DIAGNOSIS — N400 Enlarged prostate without lower urinary tract symptoms: Secondary | ICD-10-CM | POA: Diagnosis not present

## 2023-11-15 DIAGNOSIS — R1032 Left lower quadrant pain: Secondary | ICD-10-CM | POA: Insufficient documentation

## 2023-11-15 DIAGNOSIS — K449 Diaphragmatic hernia without obstruction or gangrene: Secondary | ICD-10-CM | POA: Diagnosis not present

## 2023-11-15 DIAGNOSIS — K5730 Diverticulosis of large intestine without perforation or abscess without bleeding: Secondary | ICD-10-CM | POA: Diagnosis not present

## 2023-11-15 DIAGNOSIS — K8020 Calculus of gallbladder without cholecystitis without obstruction: Secondary | ICD-10-CM | POA: Diagnosis not present

## 2023-11-15 MED ORDER — IOHEXOL 300 MG/ML  SOLN
100.0000 mL | Freq: Once | INTRAMUSCULAR | Status: AC | PRN
  Administered 2023-11-15: 100 mL via INTRAVENOUS

## 2024-01-04 DIAGNOSIS — I10 Essential (primary) hypertension: Secondary | ICD-10-CM | POA: Diagnosis not present

## 2024-01-04 DIAGNOSIS — R42 Dizziness and giddiness: Secondary | ICD-10-CM | POA: Diagnosis not present

## 2024-01-13 DIAGNOSIS — L821 Other seborrheic keratosis: Secondary | ICD-10-CM | POA: Diagnosis not present

## 2024-01-13 DIAGNOSIS — L820 Inflamed seborrheic keratosis: Secondary | ICD-10-CM | POA: Diagnosis not present

## 2024-01-13 DIAGNOSIS — D2261 Melanocytic nevi of right upper limb, including shoulder: Secondary | ICD-10-CM | POA: Diagnosis not present

## 2024-01-13 DIAGNOSIS — D2262 Melanocytic nevi of left upper limb, including shoulder: Secondary | ICD-10-CM | POA: Diagnosis not present

## 2024-01-13 DIAGNOSIS — L570 Actinic keratosis: Secondary | ICD-10-CM | POA: Diagnosis not present

## 2024-01-13 DIAGNOSIS — D2272 Melanocytic nevi of left lower limb, including hip: Secondary | ICD-10-CM | POA: Diagnosis not present

## 2024-01-13 DIAGNOSIS — D225 Melanocytic nevi of trunk: Secondary | ICD-10-CM | POA: Diagnosis not present

## 2024-01-13 DIAGNOSIS — L538 Other specified erythematous conditions: Secondary | ICD-10-CM | POA: Diagnosis not present

## 2024-01-13 DIAGNOSIS — Z85828 Personal history of other malignant neoplasm of skin: Secondary | ICD-10-CM | POA: Diagnosis not present

## 2024-01-13 DIAGNOSIS — D2271 Melanocytic nevi of right lower limb, including hip: Secondary | ICD-10-CM | POA: Diagnosis not present

## 2024-01-13 DIAGNOSIS — Z08 Encounter for follow-up examination after completed treatment for malignant neoplasm: Secondary | ICD-10-CM | POA: Diagnosis not present

## 2024-03-17 DIAGNOSIS — E782 Mixed hyperlipidemia: Secondary | ICD-10-CM | POA: Diagnosis not present

## 2024-03-17 DIAGNOSIS — Z79899 Other long term (current) drug therapy: Secondary | ICD-10-CM | POA: Diagnosis not present

## 2024-03-17 DIAGNOSIS — R972 Elevated prostate specific antigen [PSA]: Secondary | ICD-10-CM | POA: Diagnosis not present

## 2024-03-24 DIAGNOSIS — G4733 Obstructive sleep apnea (adult) (pediatric): Secondary | ICD-10-CM | POA: Diagnosis not present

## 2024-03-24 DIAGNOSIS — E782 Mixed hyperlipidemia: Secondary | ICD-10-CM | POA: Diagnosis not present

## 2024-03-24 DIAGNOSIS — Z79899 Other long term (current) drug therapy: Secondary | ICD-10-CM | POA: Diagnosis not present

## 2024-03-24 DIAGNOSIS — I10 Essential (primary) hypertension: Secondary | ICD-10-CM | POA: Diagnosis not present

## 2024-03-24 DIAGNOSIS — F5101 Primary insomnia: Secondary | ICD-10-CM | POA: Diagnosis not present

## 2024-03-24 DIAGNOSIS — J069 Acute upper respiratory infection, unspecified: Secondary | ICD-10-CM | POA: Diagnosis not present

## 2024-03-24 DIAGNOSIS — F419 Anxiety disorder, unspecified: Secondary | ICD-10-CM | POA: Diagnosis not present
